# Patient Record
Sex: Female | Born: 2000 | Hispanic: Yes | Marital: Married | Smoking: Former smoker
Health system: Southern US, Community
[De-identification: ages and names within clinical notes are randomized; demographics above are authoritative.]

## PROBLEM LIST (undated history)

## (undated) ENCOUNTER — Inpatient Hospital Stay (HOSPITAL_COMMUNITY): Payer: Self-pay

## (undated) DIAGNOSIS — Z789 Other specified health status: Secondary | ICD-10-CM

## (undated) HISTORY — PX: WISDOM TOOTH EXTRACTION: SHX21

## (undated) HISTORY — PX: NO PAST SURGERIES: SHX2092

---
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
## 2018-01-22 ENCOUNTER — Telehealth (HOSPITAL_COMMUNITY): Payer: Self-pay | Admitting: *Deleted

## 2018-01-23 ENCOUNTER — Encounter (HOSPITAL_COMMUNITY): Payer: Self-pay | Admitting: *Deleted

## 2018-01-23 ENCOUNTER — Other Ambulatory Visit: Payer: Self-pay | Admitting: Advanced Practice Midwife

## 2018-01-23 NOTE — Telephone Encounter (Signed)
[phone] interpreter number  Preadmission screen

## 2018-01-26 ENCOUNTER — Inpatient Hospital Stay (HOSPITAL_COMMUNITY)
Admission: AD | Admit: 2018-01-26 | Discharge: 2018-01-26 | Disposition: A | Discharge status: Home / Self Care | Payer: Medicaid Other | Source: Ambulatory Visit | Attending: Obstetrics and Gynecology | Admitting: Obstetrics and Gynecology

## 2018-01-26 ENCOUNTER — Other Ambulatory Visit: Payer: Self-pay

## 2018-01-26 ENCOUNTER — Encounter (HOSPITAL_COMMUNITY): Payer: Self-pay | Admitting: *Deleted

## 2018-01-26 DIAGNOSIS — Z87891 Personal history of nicotine dependence: Secondary | ICD-10-CM | POA: Diagnosis not present

## 2018-01-26 DIAGNOSIS — Z3400 Encounter for supervision of normal first pregnancy, unspecified trimester: Secondary | ICD-10-CM

## 2018-01-26 DIAGNOSIS — O09893 Supervision of other high risk pregnancies, third trimester: Secondary | ICD-10-CM

## 2018-01-26 DIAGNOSIS — O98811 Other maternal infectious and parasitic diseases complicating pregnancy, first trimester: Secondary | ICD-10-CM

## 2018-01-26 DIAGNOSIS — O471 False labor at or after 37 completed weeks of gestation: Secondary | ICD-10-CM | POA: Diagnosis not present

## 2018-01-26 DIAGNOSIS — Z3A40 40 weeks gestation of pregnancy: Secondary | ICD-10-CM | POA: Insufficient documentation

## 2018-01-26 DIAGNOSIS — A749 Chlamydial infection, unspecified: Secondary | ICD-10-CM

## 2018-01-26 DIAGNOSIS — Z283 Underimmunization status: Secondary | ICD-10-CM

## 2018-01-26 DIAGNOSIS — Z3403 Encounter for supervision of normal first pregnancy, third trimester: Secondary | ICD-10-CM

## 2018-01-26 DIAGNOSIS — Z3493 Encounter for supervision of normal pregnancy, unspecified, third trimester: Secondary | ICD-10-CM

## 2018-01-26 DIAGNOSIS — Z79899 Other long term (current) drug therapy: Secondary | ICD-10-CM | POA: Insufficient documentation

## 2018-01-26 LAB — WET PREP, GENITAL
CLUE CELLS WET PREP: NONE SEEN
Sperm: NONE SEEN
TRICH WET PREP: NONE SEEN
YEAST WET PREP: NONE SEEN

## 2018-01-26 LAB — POCT FERN TEST: POCT Fern Test: NEGATIVE

## 2018-01-26 LAB — AMNISURE RUPTURE OF MEMBRANE (ROM) NOT AT ARMC: AMNISURE: NEGATIVE

## 2018-01-26 MED ORDER — IBUPROFEN 800 MG PO TABS: 800.0000 mg | ORAL_TABLET | Freq: Once | ORAL | Status: DC

## 2018-01-26 NOTE — Discharge Instructions (Signed)
Vaginal Delivery Vaginal delivery means that you will give birth by pushing your baby out of your birth canal (vagina). A team of health care providers will help you before, during, and after vaginal delivery. Birth experiences are unique for every woman and every pregnancy, and birth experiences vary depending on where you choose to give birth. What should I do to prepare for my baby's birth? Before your baby is born, it is important to talk with your health care provider about:  Your labor and delivery preferences. These may include: ? Medicines that you may be given. ? How you will manage your pain. This might include non-medical pain relief techniques or injectable pain relief such as epidural analgesia. ? How you and your baby will be monitored during labor and delivery. ? Who may be in the labor and delivery room with you. ? Your feelings about surgical delivery of your baby (cesarean delivery, or C-section) if this becomes necessary. ? Your feelings about receiving donated blood through an IV tube (blood transfusion) if this becomes necessary.  Whether you are able: ? To take pictures or videos of the birth. ? To eat during labor and delivery. ? To move around, walk, or change positions during labor and delivery.  What to expect after your baby is born, such as: ? Whether delayed umbilical cord clamping and cutting is offered. ? Who will care for your baby right after birth. ? Medicines or tests that may be recommended for your baby. ? Whether breastfeeding is supported in your hospital or birth center. ? How long you will be in the hospital or birth center.  How any medical conditions you have may affect your baby or your labor and delivery experience.  To prepare for your baby's birth, you should also:  Attend all of your health care visits before delivery (prenatal visits) as recommended by your health care provider. This is important.  Prepare your home for your baby's  arrival. Make sure that you have: ? Diapers. ? Baby clothing. ? Feeding equipment. ? Safe sleeping arrangements for you and your baby.  Install a car seat in your vehicle. Have your car seat checked by a certified car seat installer to make sure that it is installed safely.  Think about who will help you with your new baby at home for at least the first several weeks after delivery.  What can I expect when I arrive at the birth center or hospital? Once you are in labor and have been admitted into the hospital or birth center, your health care provider may:  Review your pregnancy history and any concerns you have.  Insert an IV tube into one of your veins. This is used to give you fluids and medicines.  Check your blood pressure, pulse, temperature, and heart rate (vital signs).  Check whether your bag of water (amniotic sac) has broken (ruptured).  Talk with you about your birth plan and discuss pain control options.  Monitoring Your health care provider may monitor your contractions (uterine monitoring) and your baby's heart rate (fetal monitoring). You may need to be monitored:  Often, but not continuously (intermittently).  All the time or for long periods at a time (continuously). Continuous monitoring may be needed if: ? You are taking certain medicines, such as medicine to relieve pain or make your contractions stronger. ? You have pregnancy or labor complications.  Monitoring may be done by:  Placing a special stethoscope or a handheld monitoring device on your abdomen to   check your baby's heartbeat, and feeling your abdomen for contractions. This method of monitoring does not continuously record your baby's heartbeat or your contractions.  Placing monitors on your abdomen (external monitors) to record your baby's heartbeat and the frequency and length of contractions. You may not have to wear external monitors all the time.  Placing monitors inside of your uterus  (internal monitors) to record your baby's heartbeat and the frequency, length, and strength of your contractions. ? Your health care provider may use internal monitors if he or she needs more information about the strength of your contractions or your baby's heart rate. ? Internal monitors are put in place by passing a thin, flexible wire through your vagina and into your uterus. Depending on the type of monitor, it may remain in your uterus or on your baby's head until birth. ? Your health care provider will discuss the benefits and risks of internal monitoring with you and will ask for your permission before inserting the monitors.  Telemetry. This is a type of continuous monitoring that can be done with external or internal monitors. Instead of having to stay in bed, you are able to move around during telemetry. Ask your health care provider if telemetry is an option for you.  Physical exam Your health care provider may perform a physical exam. This may include:  Checking whether your baby is positioned: ? With the head toward your vagina (head-down). This is most common. ? With the head toward the top of your uterus (head-up or breech). If your baby is in a breech position, your health care provider may try to turn your baby to a head-down position so you can deliver vaginally. If it does not seem that your baby can be born vaginally, your provider may recommend surgery to deliver your baby. In rare cases, you may be able to deliver vaginally if your baby is head-up (breech delivery). ? Lying sideways (transverse). Babies that are lying sideways cannot be delivered vaginally.  Checking your cervix to determine: ? Whether it is thinning out (effacing). ? Whether it is opening up (dilating). ? How low your baby has moved into your birth canal.  What are the three stages of labor and delivery?  Normal labor and delivery is divided into the following three stages: Stage 1  Stage 1 is the  longest stage of labor, and it can last for hours or days. Stage 1 includes: ? Early labor. This is when contractions may be irregular, or regular and mild. Generally, early labor contractions are more than 10 minutes apart. ? Active labor. This is when contractions get longer, more regular, more frequent, and more intense. ? The transition phase. This is when contractions happen very close together, are very intense, and may last longer than during any other part of labor.  Contractions generally feel mild, infrequent, and irregular at first. They get stronger, more frequent (about every 2-3 minutes), and more regular as you progress from early labor through active labor and transition.  Many women progress through stage 1 naturally, but you may need help to continue making progress. If this happens, your health care provider may talk with you about: ? Rupturing your amniotic sac if it has not ruptured yet. ? Giving you medicine to help make your contractions stronger and more frequent.  Stage 1 ends when your cervix is completely dilated to 4 inches (10 cm) and completely effaced. This happens at the end of the transition phase. Stage 2  Once   your cervix is completely effaced and dilated to 4 inches (10 cm), you may start to feel an urge to push. It is common for the body to naturally take a rest before feeling the urge to push, especially if you received an epidural or certain other pain medicines. This rest period may last for up to 1-2 hours, depending on your unique labor experience.  During stage 2, contractions are generally less painful, because pushing helps relieve contraction pain. Instead of contraction pain, you may feel stretching and burning pain, especially when the widest part of your baby's head passes through the vaginal opening (crowning).  Your health care provider will closely monitor your pushing progress and your baby's progress through the vagina during stage 2.  Your  health care provider may massage the area of skin between your vaginal opening and anus (perineum) or apply warm compresses to your perineum. This helps it stretch as the baby's head starts to crown, which can help prevent perineal tearing. ? In some cases, an incision may be made in your perineum (episiotomy) to allow the baby to pass through the vaginal opening. An episiotomy helps to make the opening of the vagina larger to allow more room for the baby to fit through.  It is very important to breathe and focus so your health care provider can control the delivery of your baby's head. Your health care provider may have you decrease the intensity of your pushing, to help prevent perineal tearing.  After delivery of your baby's head, the shoulders and the rest of the body generally deliver very quickly and without difficulty.  Once your baby is delivered, the umbilical cord may be cut right away, or this may be delayed for 1-2 minutes, depending on your baby's health. This may vary among health care providers, hospitals, and birth centers.  If you and your baby are healthy enough, your baby may be placed on your chest or abdomen to help maintain the baby's temperature and to help you bond with each other. Some mothers and babies start breastfeeding at this time. Your health care team will dry your baby and help keep your baby warm during this time.  Your baby may need immediate care if he or she: ? Showed signs of distress during labor. ? Has a medical condition. ? Was born too early (prematurely). ? Had a bowel movement before birth (meconium). ? Shows signs of difficulty transitioning from being inside the uterus to being outside of the uterus. If you are planning to breastfeed, your health care team will help you begin a feeding. Stage 3  The third stage of labor starts immediately after the birth of your baby and ends after you deliver the placenta. The placenta is an organ that develops  during pregnancy to provide oxygen and nutrients to your baby in the womb.  Delivering the placenta may require some pushing, and you may have mild contractions. Breastfeeding can stimulate contractions to help you deliver the placenta.  After the placenta is delivered, your uterus should tighten (contract) and become firm. This helps to stop bleeding in your uterus. To help your uterus contract and to control bleeding, your health care provider may: ? Give you medicine by injection, through an IV tube, by mouth, or through your rectum (rectally). ? Massage your abdomen or perform a vaginal exam to remove any blood clots that are left in your uterus. ? Empty your bladder by placing a thin, flexible tube (catheter) into your bladder. ? Encourage   you to breastfeed your baby. After labor is over, you and your baby will be monitored closely to ensure that you are both healthy until you are ready to go home. Your health care team will teach you how to care for yourself and your baby. This information is not intended to replace advice given to you by your health care provider. Make sure you discuss any questions you have with your health care provider. Document Released: 04/12/2008 Document Revised: 01/22/2016 Document Reviewed: 07/19/2015 Elsevier Interactive Patient Education  2018 [employer]  

## 2018-01-26 NOTE — MAU Note (Signed)
Since around 1900, has been leaking fluid- clear watery.  Has changed underwear twice.  Some mucous.  Having some contractions, not regular, no bleeding. Was 3 cm when last checked.

## 2018-01-26 NOTE — MAU Provider Note (Signed)
History     CSN: [id number]  Arrival date and time: 01/26/18 1144   None     Chief Complaint  Patient presents with  . Contractions  . Rupture of Membranes   HPI Cheryl Maynard is a G1P0 at 5684w4d who presents to MAU for rule out rupture. Patient reports noticing an increase in vaginal discharge two days ago and has continued to leak since that time. Denies vaginal bleeding, decreased fetal movement, fever, falls, or recent illness. Endorses mild abdominal contractions. "Crampy" 4/10, bilateral low abdomen, not radiating, no aggravating or alleviating factors.    OB History    Gravida  1   Para      Term      Preterm      AB      Living        SAB      TAB      Ectopic      Multiple      Live Births              Past Medical History:  Diagnosis Date  . Medical history non-contributory     Past Surgical History:  Procedure Laterality Date  . NO PAST SURGERIES      Family History  Problem Relation Age of Onset  . Anemia Mother   . ADD / ADHD Neg Hx   . Alcohol abuse Neg Hx   . Anxiety disorder Neg Hx   . Asthma Neg Hx   . Arthritis Neg Hx   . Birth defects Neg Hx   . Cancer Neg Hx   . COPD Neg Hx   . Depression Neg Hx   . Diabetes Neg Hx   . Drug abuse Neg Hx   . Hearing loss Neg Hx   . Early death Neg Hx   . Heart disease Neg Hx   . Hyperlipidemia Neg Hx   . Hypertension Neg Hx   . Intellectual disability Neg Hx   . Kidney disease Neg Hx   . Learning disabilities Neg Hx   . Miscarriages / Stillbirths Neg Hx   . Obesity Neg Hx   . Stroke Neg Hx   . Vision loss Neg Hx   . Varicose Veins Neg Hx     Social History   Tobacco Use  . Smoking status: Former Smoker    Types: Cigarettes  . Smokeless tobacco: Never Used  . Tobacco comment: quit with +preg test  Substance Use Topics  . Alcohol use: No  . Drug use: No    Allergies: No Known Allergies  Medications Prior to Admission  Medication Sig Dispense Refill Last Dose   . meclizine (ANTIVERT) 12.5 MG tablet Take 1 tablet (12.5 mg total) by mouth 3 (three) times daily as needed for dizziness. (Patient not taking: Reported on 07/20/2017) 30 tablet 0 Not Taking  . Prenatal Vit-Fe Fumarate-FA (PRENATAL VITAMIN PO) Take by mouth.   Taking    Review of Systems  Gastrointestinal: Positive for abdominal pain.       Irregular mild contractions  Genitourinary: Positive for vaginal discharge. Negative for vaginal bleeding and vaginal pain.  All other systems reviewed and are negative.  Physical Exam   Blood pressure 122/77, pulse 83, temperature 97.9 F (36.6 C), temperature source Oral, resp. rate 16, weight 147 lb 12 oz (67 kg), last menstrual period 04/17/2017, SpO2 99 %.  Physical Exam  Nursing note and vitals reviewed. Constitutional: She is oriented to person, place, and time. She  appears well-developed and well-nourished.  HENT:  Head: Normocephalic.  Cardiovascular: Normal rate, regular rhythm, normal heart sounds and intact distal pulses.  Respiratory: Effort normal and breath sounds normal.  GI:  Gravid  Genitourinary: Uterus normal. Vaginal discharge found.  Genitourinary Comments: Thin white vaginal discharge visible on speculum exam Positive pooling SVE 3/50/-3  Neurological: She is alert and oriented to person, place, and time. She has normal reflexes.  Skin: Skin is warm and dry.    MAU Course  Procedures  MDM --Positive pooling --Negative Fern --Amnisure Negative --Reactive NST: baseline 130, moderate variability, positive accelerations, no decelerations --Toco: irregular mild contractions q  --3/50/-3 by my exam  Patient Vitals for the past 24 hrs:  BP Temp Temp src Pulse Resp SpO2 Weight  01/26/18 1423 123/85 - - 74 - - -  01/26/18 1203 122/77 97.9 F (36.6 C) Oral 83 16 99 % 147 lb 12 oz (67 kg)    Results for orders placed or performed during the hospital encounter of 01/26/18 (from the past 24 hour(s))  Fern Test      Status: None   Collection Time: 01/26/18 12:42 PM  Result Value Ref Range   POCT Fern Test Negative = intact amniotic membranes   Wet prep, genital     Status: Abnormal   Collection Time: 01/26/18  1:19 PM  Result Value Ref Range   Yeast Wet Prep HPF POC NONE SEEN NONE SEEN   Trich, Wet Prep NONE SEEN NONE SEEN   Clue Cells Wet Prep HPF POC NONE SEEN NONE SEEN   WBC, Wet Prep HPF POC FEW (A) NONE SEEN   Sperm NONE SEEN   Amnisure rupture of membrane (rom)not at [facility]     Status: None   Collection Time: 01/26/18  1:19 PM  Result Value Ref Range   Amnisure ROM NEGATIVE    Assessment and Plan  --17 y.o. G1P0 at [redacted]w[redacted]d  --Reactive NST --Intact membranes --Not laboring --IOL scheduled for Monday 01/29/18 --Reviewed general obstetric precautions including but not limited to falls, fever, vaginal bleeding, leaking of fluid,    decreased fetal movement, headache not relieved by Tylenol, rest and PO hydration.  --Discharge home in stable condition  Calvert Cantor, [place] 01/26/2018, 2:06 PM

## 2018-01-28 ENCOUNTER — Encounter (HOSPITAL_COMMUNITY): Payer: Self-pay | Admitting: *Deleted

## 2018-01-28 ENCOUNTER — Inpatient Hospital Stay (HOSPITAL_COMMUNITY): Payer: Medicaid Other | Admitting: Anesthesiology

## 2018-01-28 ENCOUNTER — Inpatient Hospital Stay (HOSPITAL_COMMUNITY)
Admission: AD | Admit: 2018-01-28 | Discharge: 2018-01-30 | DRG: 806 | Disposition: A | Discharge status: Home / Self Care | Payer: Medicaid Other | Source: Ambulatory Visit | Attending: Obstetrics & Gynecology | Admitting: Obstetrics & Gynecology

## 2018-01-28 ENCOUNTER — Other Ambulatory Visit: Payer: Self-pay

## 2018-01-28 DIAGNOSIS — O480 Post-term pregnancy: Secondary | ICD-10-CM | POA: Diagnosis present

## 2018-01-28 DIAGNOSIS — Z3A40 40 weeks gestation of pregnancy: Secondary | ICD-10-CM

## 2018-01-28 DIAGNOSIS — Z3403 Encounter for supervision of normal first pregnancy, third trimester: Secondary | ICD-10-CM

## 2018-01-28 LAB — CBC
HEMATOCRIT: 38.2 % (ref 36.0–46.0)
Hemoglobin: 13.2 g/dL (ref 12.0–15.0)
MCH: 30.5 pg (ref 26.0–34.0)
MCHC: 34.6 g/dL (ref 30.0–36.0)
MCV: 88.2 fL (ref 78.0–100.0)
PLATELETS: 327 10*3/uL (ref 150–400)
RBC: 4.33 MIL/uL (ref 3.87–5.11)
RDW: 13.8 % (ref 11.5–15.5)
WBC: 7.9 10*3/uL (ref 4.0–10.5)

## 2018-01-28 LAB — POCT FERN TEST: POCT Fern Test: POSITIVE

## 2018-01-28 LAB — TYPE AND SCREEN
ABO/RH(D): O POS
Antibody Screen: NEGATIVE

## 2018-01-28 LAB — ABO/RH: ABO/RH(D): O POS

## 2018-01-28 LAB — RPR: RPR Ser Ql: NONREACTIVE

## 2018-01-28 MED ORDER — COCONUT OIL OIL: 1.0000 "application " | TOPICAL_OIL | Status: DC | PRN

## 2018-01-28 MED ORDER — ONDANSETRON HCL 4 MG/2ML IJ SOLN
4.0000 mg | Freq: Four times a day (QID) | INTRAMUSCULAR | Status: DC | PRN
  Administered 2018-01-28: 4 mg via INTRAVENOUS
  Filled 2018-01-28: qty 2

## 2018-01-28 MED ORDER — LIDOCAINE HCL (PF) 1 % IJ SOLN
INTRAMUSCULAR | Status: DC | PRN
  Administered 2018-01-28: 5 mL via EPIDURAL

## 2018-01-28 MED ORDER — ACETAMINOPHEN 325 MG PO TABS: 650.0000 mg | ORAL_TABLET | ORAL | Status: DC | PRN

## 2018-01-28 MED ORDER — LACTATED RINGERS IV SOLN
500.0000 mL | INTRAVENOUS | Status: DC | PRN
  Administered 2018-01-28: 1000 mL via INTRAVENOUS

## 2018-01-28 MED ORDER — SOD CITRATE-CITRIC ACID 500-334 MG/5ML PO SOLN: 30.0000 mL | ORAL | Status: DC | PRN

## 2018-01-28 MED ORDER — LACTATED RINGERS IV SOLN
INTRAVENOUS | Status: DC
  Administered 2018-01-28 (×2): via INTRAVENOUS

## 2018-01-28 MED ORDER — DIBUCAINE 1 % RE OINT: 1.0000 "application " | TOPICAL_OINTMENT | RECTAL | Status: DC | PRN

## 2018-01-28 MED ORDER — IBUPROFEN 600 MG PO TABS
600.0000 mg | ORAL_TABLET | Freq: Four times a day (QID) | ORAL | Status: DC
  Administered 2018-01-28 – 2018-01-30 (×8): 600 mg via ORAL
  Filled 2018-01-28 (×8): qty 1

## 2018-01-28 MED ORDER — TETANUS-DIPHTH-ACELL PERTUSSIS 5-2.5-18.5 LF-MCG/0.5 IM SUSP: 0.5000 mL | Freq: Once | INTRAMUSCULAR | Status: DC

## 2018-01-28 MED ORDER — OXYTOCIN BOLUS FROM INFUSION
500.0000 mL | Freq: Once | INTRAVENOUS | Status: AC
  Administered 2018-01-28: 500 mL via INTRAVENOUS

## 2018-01-28 MED ORDER — PHENYLEPHRINE 40 MCG/ML (10ML) SYRINGE FOR IV PUSH (FOR BLOOD PRESSURE SUPPORT)
80.0000 ug | PREFILLED_SYRINGE | INTRAVENOUS | Status: DC | PRN
  Filled 2018-01-28: qty 5

## 2018-01-28 MED ORDER — OXYTOCIN 40 UNITS IN LACTATED RINGERS INFUSION - SIMPLE MED: 2.5000 [IU]/h | INTRAVENOUS | Status: DC

## 2018-01-28 MED ORDER — LACTATED RINGERS IV SOLN
500.0000 mL | Freq: Once | INTRAVENOUS | Status: DC
Start: 2018-01-28 — End: 2018-01-28

## 2018-01-28 MED ORDER — EPHEDRINE 5 MG/ML INJ: 10.0000 mg | INTRAVENOUS | Status: DC | PRN

## 2018-01-28 MED ORDER — DIPHENHYDRAMINE HCL 25 MG PO CAPS: 25.0000 mg | ORAL_CAPSULE | Freq: Four times a day (QID) | ORAL | Status: DC | PRN

## 2018-01-28 MED ORDER — PRENATAL MULTIVITAMIN CH
1.0000 | ORAL_TABLET | Freq: Every day | ORAL | Status: DC
  Administered 2018-01-29 – 2018-01-30 (×2): 1 via ORAL
  Filled 2018-01-28 (×2): qty 1

## 2018-01-28 MED ORDER — ZOLPIDEM TARTRATE 5 MG PO TABS: 5.0000 mg | ORAL_TABLET | Freq: Every evening | ORAL | Status: DC | PRN

## 2018-01-28 MED ORDER — DIPHENHYDRAMINE HCL 50 MG/ML IJ SOLN: 12.5000 mg | INTRAMUSCULAR | Status: DC | PRN

## 2018-01-28 MED ORDER — BENZOCAINE-MENTHOL 20-0.5 % EX AERO
1.0000 "application " | INHALATION_SPRAY | CUTANEOUS | Status: DC | PRN
  Administered 2018-01-28: 1 via TOPICAL
  Filled 2018-01-28: qty 56

## 2018-01-28 MED ORDER — OXYTOCIN 40 UNITS IN LACTATED RINGERS INFUSION - SIMPLE MED
1.0000 m[IU]/min | INTRAVENOUS | Status: DC
  Administered 2018-01-28: 2 m[IU]/min via INTRAVENOUS
  Filled 2018-01-28: qty 1000

## 2018-01-28 MED ORDER — ONDANSETRON HCL 4 MG PO TABS: 4.0000 mg | ORAL_TABLET | ORAL | Status: DC | PRN

## 2018-01-28 MED ORDER — EPHEDRINE 5 MG/ML INJ
10.0000 mg | INTRAVENOUS | Status: DC | PRN
  Filled 2018-01-28: qty 2

## 2018-01-28 MED ORDER — SIMETHICONE 80 MG PO CHEW: 80.0000 mg | CHEWABLE_TABLET | ORAL | Status: DC | PRN

## 2018-01-28 MED ORDER — WITCH HAZEL-GLYCERIN EX PADS: 1.0000 "application " | MEDICATED_PAD | CUTANEOUS | Status: DC | PRN

## 2018-01-28 MED ORDER — FENTANYL 2.5 MCG/ML BUPIVACAINE 1/10 % EPIDURAL INFUSION (WH - ANES)
14.0000 mL/h | INTRAMUSCULAR | Status: DC | PRN
  Administered 2018-01-28: 14 mL/h via EPIDURAL
  Filled 2018-01-28: qty 100

## 2018-01-28 MED ORDER — OXYCODONE-ACETAMINOPHEN 5-325 MG PO TABS
1.0000 | ORAL_TABLET | ORAL | Status: DC | PRN
Start: 2018-01-28 — End: 2018-01-28

## 2018-01-28 MED ORDER — PHENYLEPHRINE 40 MCG/ML (10ML) SYRINGE FOR IV PUSH (FOR BLOOD PRESSURE SUPPORT)
80.0000 ug | PREFILLED_SYRINGE | INTRAVENOUS | Status: DC | PRN
  Filled 2018-01-28: qty 5
  Filled 2018-01-28: qty 10

## 2018-01-28 MED ORDER — LACTATED RINGERS IV SOLN: 500.0000 mL | Freq: Once | INTRAVENOUS | Status: DC

## 2018-01-28 MED ORDER — METHYLERGONOVINE MALEATE 0.2 MG PO TABS
0.2000 mg | ORAL_TABLET | Freq: Four times a day (QID) | ORAL | Status: AC
  Administered 2018-01-28 – 2018-01-29 (×3): 0.2 mg via ORAL
  Filled 2018-01-28 (×3): qty 1

## 2018-01-28 MED ORDER — SENNOSIDES-DOCUSATE SODIUM 8.6-50 MG PO TABS
2.0000 | ORAL_TABLET | ORAL | Status: DC
  Administered 2018-01-28 – 2018-01-30 (×2): 2 via ORAL
  Filled 2018-01-28 (×2): qty 2

## 2018-01-28 MED ORDER — TERBUTALINE SULFATE 1 MG/ML IJ SOLN
0.2500 mg | Freq: Once | INTRAMUSCULAR | Status: DC | PRN
  Filled 2018-01-28: qty 1

## 2018-01-28 MED ORDER — ONDANSETRON HCL 4 MG/2ML IJ SOLN: 4.0000 mg | INTRAMUSCULAR | Status: DC | PRN

## 2018-01-28 MED ORDER — MEDROXYPROGESTERONE ACETATE 150 MG/ML IM SUSP
150.0000 mg | Freq: Once | INTRAMUSCULAR | Status: AC
  Administered 2018-01-30: 150 mg via INTRAMUSCULAR
  Filled 2018-01-28: qty 1

## 2018-01-28 MED ORDER — OXYCODONE-ACETAMINOPHEN 5-325 MG PO TABS: 2.0000 | ORAL_TABLET | ORAL | Status: DC | PRN

## 2018-01-28 MED ORDER — LIDOCAINE HCL (PF) 1 % IJ SOLN
30.0000 mL | INTRAMUSCULAR | Status: DC | PRN
  Filled 2018-01-28: qty 30

## 2018-01-28 MED ORDER — FENTANYL CITRATE (PF) 100 MCG/2ML IJ SOLN: 100.0000 ug | INTRAMUSCULAR | Status: DC | PRN

## 2018-01-28 MED ORDER — PHENYLEPHRINE 40 MCG/ML (10ML) SYRINGE FOR IV PUSH (FOR BLOOD PRESSURE SUPPORT): 80.0000 ug | PREFILLED_SYRINGE | INTRAVENOUS | Status: DC | PRN

## 2018-01-28 NOTE — Anesthesia Preprocedure Evaluation (Signed)
Anesthesia Evaluation  Patient identified by MRN, date of birth, ID band Patient awake    Reviewed: Allergy & Precautions, NPO status , Patient's Chart, lab work & pertinent test results  Airway Mallampati: II  TM Distance: >3 FB Neck ROM: Full    Dental no notable dental hx. (+) Teeth Intact   Pulmonary neg pulmonary ROS, former smoker,    Pulmonary exam normal breath sounds clear to auscultation       Cardiovascular negative cardio ROS Normal cardiovascular exam Rhythm:Regular Rate:Normal     Neuro/Psych negative neurological ROS  negative psych ROS   GI/Hepatic   Endo/Other    Renal/GU      Musculoskeletal   Abdominal   Peds  Hematology   Anesthesia Other Findings   Reproductive/Obstetrics (+) Pregnancy                             Lab Results  Component Value Date   WBC 7.9 01/28/2018   HGB 13.2 01/28/2018   HCT 38.2 01/28/2018   MCV 88.2 01/28/2018   PLT 327 01/28/2018    Anesthesia Physical Anesthesia Plan  ASA: II  Anesthesia Plan: Epidural   Post-op Pain Management:    Induction:   PONV Risk Score and Plan:   Airway Management Planned:   Additional Equipment:   Intra-op Plan:   Post-operative Plan:   Informed Consent: I have reviewed the patients History and Physical, chart, labs and discussed the procedure including the risks, benefits and alternatives for the proposed anesthesia with the patient or authorized representative who has indicated his/her understanding and acceptance.     Plan Discussed with:   Anesthesia Plan Comments:         Anesthesia Quick Evaluation

## 2018-01-28 NOTE — Progress Notes (Signed)
Patient ID: [name], female   DOB: 2001/01/28, 17 y.o.   MRN: [id number]  Labor Progress Note [name] is a 17 y.o. G1P0 at 4317w6d presented for SOL.  S: introduced self to patient. Patient said she was ok and didn't need anything at the moment.   O:  BP 122/75   Pulse 81   Temp 98.1 F (36.7 C) (Oral)   Resp 18   Ht 5' (1.524 m)   Wt 67.1 kg (148 lb)   LMP 04/17/2017   SpO2 99%   BMI 28.90 kg/m  EFM: 135/moderate variablity/present accels.   CVE: Dilation: 6 Effacement (%): 100 Station: -1 Presentation: Vertex Exam by:: Valentina Lucks. Woods, RN   A&P: 17 y.o. G1P0 6017w6d for SOL.  #Labor: Progressing well. Patient currently on pitocin. SROM #Pain: epidural #FWB: cat I #GBS negative   Sandre Kittyaniel K Terasa Orsini, MD 11:40 AM

## 2018-01-28 NOTE — Progress Notes (Signed)
Eliseo GumVioleta Gonzalez-islas is a 17 y.o. G1P0 at 3937w6d by ultrasound admitted for active labor  Subjective:   Objective: BP 122/78   Pulse 76   Temp 98 F (36.7 C) (Oral)   Resp 18   Ht 5' (1.524 m)   Wt 148 lb (67.1 kg)   LMP 04/17/2017   SpO2 99%   BMI 28.90 kg/m  No intake/output data recorded. Total I/O In: 1134.6 [I.V.:1134.6] Out: 300 [Urine:300]  FHT:  FHR: 120's bpm, variability: moderate,  accelerations:  Present,  decelerations:  Absent UC:   regular, every 2 minutes SVE:   Dilation: 6 Effacement (%): 100 Station: -1 Exam by:: Valentina Lucks. Woods, RN  Labs: Lab Results  Component Value Date   WBC 7.9 01/28/2018   HGB 13.2 01/28/2018   HCT 38.2 01/28/2018   MCV 88.2 01/28/2018   PLT 327 01/28/2018    Assessment / Plan: Spontaneous labor, progressing normally  Labor: Progressing normally Preeclampsia:  no signs or symptoms of toxicity Fetal Wellbeing:  Category I Pain Control:  Epidural I/D:  n/a Anticipated MOD:  NSVD  Wyvonnia DuskyMarie Haislee Corso 01/28/2018, 9:27 AM

## 2018-01-28 NOTE — Anesthesia Postprocedure Evaluation (Signed)
Anesthesia Post Note  Patient: Cheryl Maynard  Procedure(s) Performed: AN AD HOC LABOR EPIDURAL     Patient location during evaluation: Mother Baby Anesthesia Type: Epidural Level of consciousness: awake and alert and oriented Pain management: satisfactory to patient Vital Signs Assessment: post-procedure vital signs reviewed and stable Respiratory status: respiratory function stable Cardiovascular status: stable Postop Assessment: no headache, no backache, epidural receding, patient able to bend at knees, no signs of nausea or vomiting and adequate PO intake Anesthetic complications: no    Last Vitals:  Vitals:   01/28/18 1650 01/28/18 1744  BP: 123/79 133/88  Pulse: 82 78  Resp: 18 18  Temp: 36.9 C 36.8 C  SpO2:      Last Pain:  Vitals:   01/28/18 1744  TempSrc: Oral  PainSc:    Pain Goal: Patients Stated Pain Goal: 8 (01/28/18 0344)               Karleen DolphinFUSSELL,Cheryl Maynard

## 2018-01-28 NOTE — Anesthesia Procedure Notes (Signed)
Epidural Patient location during procedure: OB Start time: 01/28/2018 7:52 AM End time: 01/28/2018 8:07 AM  Staffing Anesthesiologist: Trevor IhaHouser, Stephen A, MD Performed: anesthesiologist   Preanesthetic Checklist Completed: patient identified, site marked, surgical consent, pre-op evaluation, timeout performed, IV checked, risks and benefits discussed and monitors and equipment checked  Epidural Patient position: sitting Prep: site prepped and draped and DuraPrep Patient monitoring: continuous pulse ox and blood pressure Approach: midline Location: L3-L4 Injection technique: LOR air  Needle:  Needle type: Tuohy  Needle gauge: 17 G Needle length: 9 cm and 9 Needle insertion depth: 5 cm cm Catheter type: closed end flexible Catheter size: 19 Gauge Catheter at skin depth: 10 cm Test dose: negative  Assessment Events: blood not aspirated, injection not painful, no injection resistance, negative IV test and no paresthesia

## 2018-01-28 NOTE — MAU Note (Signed)
Pt presents to MAU c/o possible SROM pt states around 1500 01/27/18 she noticed some clear discharge that over the hours began to trickle down her leg. Pt states she is ctx every 5min. +FM.

## 2018-01-28 NOTE — H&P (Addendum)
OBSTETRIC ADMISSION HISTORY AND PHYSICAL  Cheryl Maynard is a 17 y.o. female G1P0 with IUP at 4076w6d by first trimester U/S presenting for active labor s/p SROM at 1500. Hx of treatement for positive chlamydia on 07/06/17; TOC negative x2. She reports +FMs, LOF today at 1500 , no VB, no blurry vision, headaches or peripheral edema, and RUQ pain.  She plans on breast feeding. She request Depo for birth control. [facility]   Dating: By First trimester U/S --->  Estimated Date of Delivery: 01/22/18  Sono:   -SIUP at 19+2 weeks,  No gross abnormalities identified, AFI normal, cervical length 3.9cm, cephalic, placenta posterior, EFW 286.39g  Prenatal History/Complications: -Hx of positive chlamydia on 07/06/17---received azithromycin -varicella non-immune  Past Medical History: Past Medical History:  Diagnosis Date  . Medical history non-contributory     Past Surgical History: Past Surgical History:  Procedure Laterality Date  . NO PAST SURGERIES      Obstetrical History: OB History    Gravida  1   Para      Term      Preterm      AB      Living        SAB      TAB      Ectopic      Multiple      Live Births              Social History: Social History   Socioeconomic History  . Marital status: Single    Spouse name: Not on file  . Number of children: Not on file  . Years of education: Not on file  . Highest education level: Not on file  Occupational History  . Not on file  Social Needs  . Financial resource strain: Not on file  . Food insecurity:    Worry: Not on file    Inability: Not on file  . Transportation needs:    Medical: Not on file    Non-medical: Not on file  Tobacco Use  . Smoking status: Former Smoker    Types: Cigarettes  . Smokeless tobacco: Never Used  . Tobacco comment: quit with +preg test  Substance and Sexual Activity  . Alcohol use: No  . Drug use: No  . Sexual activity: Yes    Birth  control/protection: None  Lifestyle  . Physical activity:    Days per week: Not on file    Minutes per session: Not on file  . Stress: Not on file  Relationships  . Social connections:    Talks on phone: Not on file    Gets together: Not on file    Attends religious service: Not on file    Active member of club or organization: Not on file    Attends meetings of clubs or organizations: Not on file    Relationship status: Not on file  Other Topics Concern  . Not on file  Social History Narrative  . Not on file    Family History: Family History  Problem Relation Age of Onset  . Anemia Mother   . ADD / ADHD Neg Hx   . Alcohol abuse Neg Hx   . Anxiety disorder Neg Hx   . Asthma Neg Hx   . Arthritis Neg Hx   . Birth defects Neg Hx   . Cancer Neg Hx   . COPD Neg Hx   . Depression Neg Hx   . Diabetes Neg Hx   .  Drug abuse Neg Hx   . Hearing loss Neg Hx   . Early death Neg Hx   . Heart disease Neg Hx   . Hyperlipidemia Neg Hx   . Hypertension Neg Hx   . Intellectual disability Neg Hx   . Kidney disease Neg Hx   . Learning disabilities Neg Hx   . Miscarriages / Stillbirths Neg Hx   . Obesity Neg Hx   . Stroke Neg Hx   . Vision loss Neg Hx   . Varicose Veins Neg Hx     Allergies: No Known Allergies  Medications Prior to Admission  Medication Sig Dispense Refill Last Dose  . Prenatal Vit-Fe Fumarate-FA (PRENATAL VITAMIN PO) Take by mouth.   01/27/2018 at Unknown time     Review of Systems   All systems reviewed and negative except as stated in HPI  Blood pressure 136/87, pulse 77, temperature 98.2 F (36.8 C), temperature source Oral, height 5' (1.524 m), last menstrual period 04/17/2017. General appearance: alert, cooperative and no distress Lungs: no respiratory distress Heart: regular rate and rhythm Abdomen: soft, non-tender Pelvic: adequate Extremities: Homans sign is negative, no sign of DVT Presentation: Vertex Fetal monitoringBaseline: 130 bpm,  Variability: Good {> 6 bpm), Accelerations: Present and Decelerations: Absent Uterine activity: Difficult to monitor on screen but pt states getting stronger and closer together Dilation: 5 Effacement (%): 90 Station: -1 Exam by:: Ralene Muskrat RN    Prenatal labs: ABO, Rh: O/Positive/-- (12/20 0000) Antibody: Negative (12/20 0000) Rubella: Immune (12/20 0000) RPR: Nonreactive (12/20 0000)  HBsAg: Negative (12/20 0000)  HIV: Non-reactive (12/20 0000)  GBS: Negative (06/13 0000)  1 hr Glucola: normal  Genetic screening: normal Anatomy US: Normal female  Prenatal Transfer Tool  Maternal Diabetes: No Genetic Screening: Normal Maternal Ultrasounds/Referrals: Normal Fetal Ultrasounds or other Referrals:  None Maternal Substance Abuse:  No Significant Maternal Medications:  None Significant Maternal Lab Results: Lab values include: Group B Strep negative, Other: Positive Chlamydia screen 07/06/17, treated with azithromycin  Results for orders placed or performed during the hospital encounter of 01/28/18 (from the past 24 hour(s))  Fern Test   Collection Time: 01/28/18  3:01 AM  Result Value Ref Range   POCT Fern Test Positive = ruptured amniotic membanes   CBC   Collection Time: 01/28/18  3:15 AM  Result Value Ref Range   WBC 7.9 4.0 - 10.5 K/uL   RBC 4.33 3.87 - 5.11 MIL/uL   Hemoglobin 13.2 12.0 - 15.0 g/dL   HCT 56.3 87.5 - 64.3 %   MCV 88.2 78.0 - 100.0 fL   MCH 30.5 26.0 - 34.0 pg   MCHC 34.6 30.0 - 36.0 g/dL   RDW 32.9 51.8 - 84.1 %   Platelets 327 150 - 400 K/uL    Patient Active Problem List   Diagnosis Date Noted  . Normal labor 01/28/2018  . Susceptible to Varicella (non-immune), currently pregnant in third trimester 01/26/2018  . Encounter for supervision of normal first pregnancy in third trimester 01/26/2018  . Chlamydia infection affecting pregnancy in first trimester 01/26/2018  . Supervision of normal first teen pregnancy 01/26/2018    Assessment/Plan:   Cheryl Maynard is a 17 y.o. G1P0 at [redacted]w[redacted]d here for active labor s/p SOL.  #Labor: Expectant management with labor support #Pain: Epidural at patients request, IV pain medication first #FWB: Category 1 #ID: N/A #MOF: Breast #MOC:Depo  Rolland Bimler, Medical Student  01/28/2018, 3:58 AM  OB FELLOW HISTORY AND PHYSICAL ATTESTATION  I confirm that I have verified the information documented in the medical student's note and that I have also personally reperformed the physical exam and all medical decision making activities. I agree with above documentation and have made edits as needed.   Caryl Ada, DO OB Fellow 01/28/2018, 5:33 AM

## 2018-01-28 NOTE — Anesthesia Pain Management Evaluation Note (Signed)
  CRNA Pain Management Visit Note  Patient: Cheryl Maynard, 17 y.o., female  "Hello I am a member of the anesthesia team at [facility]. We have an anesthesia team available at all times to provide care throughout the hospital, including epidural management and anesthesia for C-section. I don't know your plan for the delivery whether it a natural birth, water birth, IV sedation, nitrous supplementation, doula or epidural, but we want to meet your pain goals."   1.Was your pain managed to your expectations on prior hospitalizations?   No prior hospitalizations  2.What is your expectation for pain management during this hospitalization?     Epidural  3.How can we help you reach that goal? Epidural being placed.  Record the patient's initial score and the patient's pain goal.   Pain: 8  Pain Goal: 8 [facility] wants you to be able to say your pain was always managed very well.  Briar Sword 01/28/2018

## 2018-01-28 NOTE — MAU Provider Note (Signed)
First Provider Initiated Contact with Patient 01/28/18 [phone]       S: Ms. Cheryl Maynard is a 17 y.o. G1P0 at 8222w6d  who presents to MAU today complaining of leaking of fluid since 3 pm on 01/27/18. She denies vaginal bleeding. She endorses contractions. She reports normal fetal movement.    O: LMP 04/17/2017  GENERAL: Well-developed, well-nourished female in no acute distress.  HEAD: Normocephalic, atraumatic.  CHEST: Normal effort of breathing, regular heart rate ABDOMEN: Soft, nontender, gravid PELVIC: Normal external female genitalia. Vagina is pink and rugated. Cervix with normal contour, no lesions. Normal discharge.  positive pooling.   Cervical exam:  Dilation: 5 Effacement (%): 90 Station: -1 Presentation: Vertex Exam by:: Cheryl Cox RN    Fetal Monitoring: Baseline:130 Variability: moderate Accelerations: present Decelerations: early decels only Contractions: Q 3-4 min  Results for orders placed or performed during the hospital encounter of 01/28/18 (from the past 24 hour(s))  Fern Test     Status: Abnormal   Collection Time: 01/28/18  3:01 AM  Result Value Ref Range   POCT Fern Test Positive = ruptured amniotic membanes      A: SIUP at 6922w6d  SROM  Active labor at term  P: RN to notify [facility] providers [facility]  Hurshel PartyLeftwich-Kirby, Rose Hegner A, CNM 01/28/2018 3:06 AM

## 2018-01-29 ENCOUNTER — Inpatient Hospital Stay (HOSPITAL_COMMUNITY): Admission: RE | Admit: 2018-01-29 | Payer: Medicaid Other | Source: Ambulatory Visit

## 2018-01-29 NOTE — Lactation Note (Addendum)
This note was copied from a baby's chart. Lactation Consultation Note Baby 14 hrs old. Mom stated she didn't need an interpreter. Mom has small short shaft compressible nipples. Hand expression w/colostrum noted.  Hand pump given to pre-pump prior to latching. Mom states baby isn't latching well. Asked mom to call for next latch for assistance. Newborn behavior, STS, I&O, cluster feeding, supply and demand. Mom encouraged to feed baby 8-12 times/24 hours and with feeding cues. Mom encouraged to waken baby for feeds if hasn't cued in 3 hrs. Call for assistance.  WH/LC brochure given w/resources, support groups and LC services.  Patient Name: Cheryl Maynard [id number]'s Date: 01/29/2018 Reason for consult: Initial assessment   Maternal Data Has patient been taught Hand Expression?: Yes Does the patient have breastfeeding experience prior to this delivery?: No  Feeding Feeding Type: Breast Fed Length of feed: 50 min  LATCH Score       Type of Nipple: Everted at rest and after stimulation(short shaft)  Comfort (Breast/Nipple): Soft / non-tender        Interventions Interventions: Breast feeding basics reviewed;Position options;Skin to skin;Breast massage;Hand express;Pre-pump if needed;Hand pump;Breast compression  Lactation Tools Discussed/Used Tools: Pump Breast pump type: Manual WIC Program: No Pump Review: Setup, frequency, and cleaning;Milk Storage Initiated by:: Peri JeffersonL. Shubham Thackston RN IBCLC Date initiated:: 01/29/18   Consult Status Consult Status: Follow-up Date: 01/29/18 Follow-up type: In-patient    Charyl DancerCARVER, Tobyn Osgood G 01/29/2018, 4:16 AM

## 2018-01-29 NOTE — Progress Notes (Signed)
POSTPARTUM PROGRESS NOTE  Post Partum Day 1  Subjective:  Cheryl Maynard is a 17 y.o. G1P1001 6941w6d s/p svd/sol augmented with pitocin.  No acute events overnight.  Pt denies problems with ambulating, voiding or po intake.  She denies nausea or vomiting.  Pain is well controlled. Lochia Moderate. Pt reports that it is not heavy and she is not concerned about the amount.   Objective: Blood pressure 110/71, pulse 71, temperature 98.3 F (36.8 C), temperature source Oral, resp. rate 18, height 5' (1.524 m), weight 67.1 kg (148 lb), last menstrual period 04/17/2017, SpO2 97 %, unknown if currently breastfeeding.  Physical Exam:  General: alert, cooperative and no distress Lochia:normal flow Pulm: no respiratory distress, ctab Heart:regular rate, no m/r/g auscultated Abdomen: soft, nontender,  Uterine Fundus: firm, appropriately tender DVT Evaluation: No calf swelling or tenderness Extremities: no edema  Laceration: R. Vaginal/Labial  Recent Labs    01/28/18 0315  HGB 13.2  HCT 38.2    Assessment/Plan:  ASSESSMENT: Cheryl Maynard a 17 y.o. G1P1001 3441w6d s/p  ppd1 svd, afebrile, w. vitals w/in normal limits, and an unremarkable physical exam,  is  recovering well.   Plan for discharge tomorrow 1. Plans to bottle feed and supplement with formula 2. Will receive depo-provera in hospital    LOS: 1 day   Chika I Chukwu MS3 01/29/2018, 8:54 AM

## 2018-01-30 MED ORDER — IBUPROFEN 600 MG PO TABS: 600.0000 mg | ORAL_TABLET | Freq: Four times a day (QID) | ORAL | 0 refills | Status: DC

## 2018-01-30 NOTE — Discharge Summary (Signed)
OB Discharge Summary     Patient Name: Cheryl Maynard DOB: September 20, 2000 MRN: [id number]  Date of admission: 01/28/2018 Delivering MD: Lenor CoffinLSON, Henreitta Spittler K   Date of discharge: 01/30/2018  Admitting diagnosis: 40wks, contractions and leaking Intrauterine pregnancy: 7461w6d     Secondary diagnosis:  Active Problems:   Normal labor   SVD (spontaneous vaginal delivery)  Additional problems: none     Discharge diagnosis: Term Pregnancy Delivered                                                                                                Post partum procedures:none  Augmentation: Pitocin  Complications: None  Hospital course:  Onset of Labor With Vaginal Delivery     17 y.o. yo G1P1001 at 861w6d was admitted in Active Labor on 01/28/2018. Patient had an uncomplicated labor course as follows:  Membrane Rupture Time/Date: 3:00 PM ,01/27/2018   Intrapartum Procedures: Episiotomy: None [1]                                         Lacerations:  Vaginal [6];Labial [10]  Patient had a delivery of a Viable infant. 01/28/2018  Information for the patient's newborn:  Cheryl Maynard, Cheryl Maynard [[id number]][[id number]]       Pateint had an uncomplicated postpartum course.  She is ambulating, tolerating a regular diet, passing flatus, and urinating well. Patient is discharged home in stable condition on 01/30/18.   Physical exam  Vitals:   01/29/18 0533 01/29/18 1416 01/29/18 2242 01/30/18 0601  BP: 110/71 131/84 125/84 122/79  Pulse: 71 79 77 78  Resp:  18 18 18   Temp: 98.3 F (36.8 C) 98.4 F (36.9 C) 98 F (36.7 C) 97.9 F (36.6 C)  TempSrc: Oral Oral    SpO2: 97% 97%    Weight:      Height:       General: alert, cooperative and no distress Lochia: appropriate Uterine Fundus: firm Incision: N/A DVT Evaluation: No evidence of DVT seen on physical exam. Labs: Lab Results  Component Value Date   WBC 7.9 01/28/2018   HGB 13.2 01/28/2018   HCT 38.2 01/28/2018   MCV 88.2 01/28/2018    PLT 327 01/28/2018   No flowsheet data found.  Discharge instruction: per After Visit Summary and "Baby and Me Booklet".  After visit meds:  Allergies as of 01/30/2018   No Known Allergies     Medication List    STOP taking these medications   PRENATAL VITAMIN PO     TAKE these medications   ibuprofen 600 MG tablet Commonly known as:  ADVIL,MOTRIN Take 1 tablet (600 mg total) by mouth every 6 (six) hours.       Diet: routine diet  Activity: Advance as tolerated. Pelvic rest for 6 weeks.   Outpatient follow up:6 weeks Follow up Appt:No future appointments. Follow up Visit:No follow-ups on file.  Postpartum contraception: Depo Provera  Newborn Data: Live born female  Birth Weight: 6 lb 3.6 oz (2824 g) APGAR: 6,  9  Newborn Delivery   Birth date/time:  01/28/2018 14:09:00 Delivery type:  Vaginal, Spontaneous     Baby Feeding: Breast Disposition:home with mother   01/30/2018 Sandre Kitty, MD

## 2018-01-30 NOTE — Lactation Note (Signed)
This note was copied from a baby's chart. Lactation Consultation Note  Patient Name: Cheryl Maynard [id number] Date: 01/30/2018 Reason for consult: Follow-up assessment;Infant weight loss;1st time breastfeeding;Primapara;Term(2% weight loss/ breast / formula ( moms preference ) )  Baby is 1 hours old  LC reviewed and updated the doc flow sheets per mom.  Mom has been mostly breast feeding and as LC entered the room, baby latched in cross cradle,  And mom comfortable. Mom denies soreness and breast are filling.  Depth noted and swallows.  Sore nipple and engorgement prevention and tx reviewed.  Mom has hand pump/ and the DEBP kit/ active with [facility] as a resource.  LC discussed nutritive vs non - nutritive feeding patterns and the importance of STS  Feedings until the baby can stay awake for feedings.  Also supply and demand. When milk is in if to full / hand express, or pre-pump with hand pump  Off to create a sandwich with tissue and LATCH with breast compressions until swallows.  Offer 2nd breast / if the baby doesn't take it , to release to comfort and next feeding offer than breast.  Mother informed of post-discharge support and given phone number to the lactation department, including services for phone call assistance; out-patient appointments; and breastfeeding support group. List of other breastfeeding resources in the community given in the handout. Encouraged mother to call for problems or concerns related to breastfeeding.    Maternal Data Has patient been taught Hand Expression?: Yes(per mom feels comfortable )  Feeding Feeding Type: (baby latched with depth ) Length of feed: 20 min(LC obs baby latched with depth/ swallows )  LATCH Score Latch: (latched with depth / cross cradle )  Audible Swallowing: (swallows noted )     Comfort (Breast/Nipple): (per mom comfortable )  Hold (Positioning): (mom independent with latch )      Interventions Interventions: Breast feeding basics reviewed;Hand pump  Lactation Tools Discussed/Used Tools: Pump Breast pump type: Manual   Consult Status Consult Status: Complete Date: 01/30/18    Jerlyn Ly Falesha Schommer 01/30/2018, 11:03 AM

## 2018-01-30 NOTE — Discharge Instructions (Signed)
Vaginal Delivery, Care After °Refer to this sheet in the next few weeks. These instructions provide you with information about caring for yourself after vaginal delivery. Your health care provider may also give you more specific instructions. Your treatment has been planned according to current medical practices, but problems sometimes occur. Call your health care provider if you have any problems or questions. °What can I expect after the procedure? °After vaginal delivery, it is common to have: °· Some bleeding from your vagina. °· Soreness in your abdomen, your vagina, and the area of skin between your vaginal opening and your anus (perineum). °· Pelvic cramps. °· Fatigue. ° °Follow these instructions at home: °Medicines °· Take over-the-counter and prescription medicines only as told by your health care provider. °· If you were prescribed an antibiotic medicine, take it as told by your health care provider. Do not stop taking the antibiotic until it is finished. °Driving ° °· Do not drive or operate heavy machinery while taking prescription pain medicine. °· Do not drive for 24 hours if you received a sedative. °Lifestyle °· Do not drink alcohol. This is especially important if you are breastfeeding or taking medicine to relieve pain. °· Do not use tobacco products, including cigarettes, chewing tobacco, or e-cigarettes. If you need help quitting, ask your health care provider. °Eating and drinking °· Drink at least 8 eight-ounce glasses of water every day unless you are told not to by your health care provider. If you choose to breastfeed your baby, you may need to drink more water than this. °· Eat high-fiber foods every day. These foods may help prevent or relieve constipation. High-fiber foods include: °? Whole grain cereals and breads. °? Brown rice. °? Beans. °? Fresh fruits and vegetables. °Activity °· Return to your normal activities as told by your health care provider. Ask your health care provider  what activities are safe for you. °· Rest as much as possible. Try to rest or take a nap when your baby is sleeping. °· Do not lift anything that is heavier than your baby or 10 lb (4.5 kg) until your health care provider says that it is safe. °· Talk with your health care provider about when you can engage in sexual activity. This may depend on your: °? Risk of infection. °? Rate of healing. °? Comfort and desire to engage in sexual activity. °Vaginal Care °· If you have an episiotomy or a vaginal tear, check the area every day for signs of infection. Check for: °? More redness, swelling, or pain. °? More fluid or blood. °? Warmth. °? Pus or a bad smell. °· Do not use tampons or douches until your health care provider says this is safe. °· Watch for any blood clots that may pass from your vagina. These may look like clumps of dark red, brown, or black discharge. °General instructions °· Keep your perineum clean and dry as told by your health care provider. °· Wear loose, comfortable clothing. °· Wipe from front to back when you use the toilet. °· Ask your health care provider if you can shower or take a bath. If you had an episiotomy or a perineal tear during labor and delivery, your health care provider may tell you not to take baths for a certain length of time. °· Wear a bra that supports your breasts and fits you well. °· If possible, have someone help you with household activities and help care for your baby for at least a few days after   you leave the hospital. °· Keep all follow-up visits for you and your baby as told by your health care provider. This is important. °Contact a health care provider if: °· You have: °? Vaginal discharge that has a bad smell. °? Difficulty urinating. °? Pain when urinating. °? A sudden increase or decrease in the frequency of your bowel movements. °? More redness, swelling, or pain around your episiotomy or vaginal tear. °? More fluid or blood coming from your episiotomy or  vaginal tear. °? Pus or a bad smell coming from your episiotomy or vaginal tear. °? A fever. °? A rash. °? Little or no interest in activities you used to enjoy. °? Questions about caring for yourself or your baby. °· Your episiotomy or vaginal tear feels warm to the touch. °· Your episiotomy or vaginal tear is separating or does not appear to be healing. °· Your breasts are painful, hard, or turn red. °· You feel unusually sad or worried. °· You feel nauseous or you vomit. °· You pass large blood clots from your vagina. If you pass a blood clot from your vagina, save it to show to your health care provider. Do not flush blood clots down the toilet without having your health care provider look at them. °· You urinate more than usual. °· You are dizzy or light-headed. °· You have not breastfed at all and you have not had a menstrual period for 12 weeks after delivery. °· You have stopped breastfeeding and you have not had a menstrual period for 12 weeks after you stopped breastfeeding. °Get help right away if: °· You have: °? Pain that does not go away or does not get better with medicine. °? Chest pain. °? Difficulty breathing. °? Blurred vision or spots in your vision. °? Thoughts about hurting yourself or your baby. °· You develop pain in your abdomen or in one of your legs. °· You develop a severe headache. °· You faint. °· You bleed from your vagina so much that you fill two sanitary pads in one hour. °This information is not intended to replace advice given to you by your health care provider. Make sure you discuss any questions you have with your health care provider. °Document Released: 07/01/2000 Document Revised: 12/16/2015 Document Reviewed: 07/19/2015 °[employer]vier Interactive Patient Education © 2018 [employer] ° °

## 2020-07-09 ENCOUNTER — Other Ambulatory Visit: Payer: Self-pay

## 2020-07-09 ENCOUNTER — Ambulatory Visit
Admission: EM | Admit: 2020-07-09 | Discharge: 2020-07-09 | Disposition: A | Discharge status: Home / Self Care | Payer: Medicaid Other | Attending: Emergency Medicine | Admitting: Emergency Medicine

## 2020-07-09 DIAGNOSIS — H00015 Hordeolum externum left lower eyelid: Secondary | ICD-10-CM | POA: Diagnosis not present

## 2020-07-09 MED ORDER — POLYMYXIN B-TRIMETHOPRIM 10000-0.1 UNIT/ML-% OP SOLN: 1.0000 [drp] | OPHTHALMIC | 0 refills | Status: DC

## 2020-07-09 NOTE — ED Provider Notes (Signed)
[facility]    CSN: [id number] Arrival date & time: 07/09/20  1158      History   Chief Complaint Chief Complaint  Patient presents with  . Eye Pain    X 3 days    HPI Cheryl Maynard is a 19 y.o. female  Presenting for left lower eye pain and swelling x3 days.  Denies trauma, injury, contact lens use.  No visual changes, photophobia, foreign body exposure sensation.  Has not tried nothing for this.  Past Medical History:  Diagnosis Date  . Medical history non-contributory     Patient Active Problem List   Diagnosis Date Noted  . Normal labor 01/28/2018  . SVD (spontaneous vaginal delivery) 01/28/2018  . Susceptible to Varicella (non-immune), currently pregnant in third trimester 01/26/2018  . Encounter for supervision of normal first pregnancy in third trimester 01/26/2018  . Chlamydia infection affecting pregnancy in first trimester 01/26/2018  . Supervision of normal first teen pregnancy 01/26/2018    Past Surgical History:  Procedure Laterality Date  . NO PAST SURGERIES      OB History    Gravida  2   Para  1   Term  1   Preterm      AB      Living  1     SAB      IAB      Ectopic      Multiple  0   Live Births  1            Home Medications    Prior to Admission medications   Medication Sig Start Date End Date Taking? Authorizing Provider  ibuprofen (ADVIL,MOTRIN) 600 MG tablet Take 1 tablet (600 mg total) by mouth every 6 (six) hours. 01/30/18   Sandre Kitty, MD  trimethoprim-polymyxin b Joaquim Lai) ophthalmic solution Place 1 drop into the left eye every 4 (four) hours. 07/09/20   Hall-Potvin, [place], PA-C    Family History Family History  Problem Relation Age of Onset  . Anemia Mother   . ADD / ADHD Neg Hx   . Alcohol abuse Neg Hx   . Anxiety disorder Neg Hx   . Asthma Neg Hx   . Arthritis Neg Hx   . Birth defects Neg Hx   . Cancer Neg Hx   . COPD Neg Hx   . Depression Neg Hx   . Diabetes Neg  Hx   . Drug abuse Neg Hx   . Hearing loss Neg Hx   . Early death Neg Hx   . Heart disease Neg Hx   . Hyperlipidemia Neg Hx   . Hypertension Neg Hx   . Intellectual disability Neg Hx   . Kidney disease Neg Hx   . Learning disabilities Neg Hx   . Miscarriages / Stillbirths Neg Hx   . Obesity Neg Hx   . Stroke Neg Hx   . Vision loss Neg Hx   . Varicose Veins Neg Hx     Social History Social History   Tobacco Use  . Smoking status: Former Smoker    Types: Cigarettes  . Smokeless tobacco: Never Used  . Tobacco comment: quit with +preg test  Vaping Use  . Vaping Use: Never used  Substance Use Topics  . Alcohol use: No  . Drug use: No     Allergies   Patient has no known allergies.   Review of Systems Review of Systems  Constitutional: Negative for fatigue and fever.  HENT: Negative  for ear pain, sinus pain, sore throat and voice change.   Eyes: Positive for pain. Negative for redness and visual disturbance.  Respiratory: Negative for cough and shortness of breath.   Cardiovascular: Negative for chest pain and palpitations.  Gastrointestinal: Negative for abdominal pain, diarrhea and vomiting.  Musculoskeletal: Negative for arthralgias and myalgias.  Skin: Negative for rash and wound.  Neurological: Negative for syncope and headaches.     Physical Exam Triage Vital Signs ED Triage Vitals  Enc Vitals Group     BP 07/09/20 1232 112/74     Pulse Rate 07/09/20 1232 87     Resp --      Temp 07/09/20 1232 98 F (36.7 C)     Temp Source 07/09/20 1232 Oral     SpO2 07/09/20 1232 99 %     Weight --      Height --      Head Circumference --      Peak Flow --      Pain Score 07/09/20 1234 5     Pain Loc --      Pain Edu? --      Excl. in GC? --    No data found.  Updated Vital Signs BP 112/74 (BP Location: Right Arm)   Pulse 87   Temp 98 F (36.7 C) (Oral)   SpO2 99%   Visual Acuity Right Eye Distance:   Left Eye Distance:   Bilateral Distance:     Right Eye Near:   Left Eye Near:    Bilateral Near:     Physical Exam Constitutional:      General: She is not in acute distress. HENT:     Head: Normocephalic and atraumatic.  Eyes:     General: No scleral icterus.       Right eye: No discharge.        Left eye: No discharge.     Extraocular Movements: Extraocular movements intact.     Conjunctiva/sclera: Conjunctivae normal.     Pupils: Pupils are equal, round, and reactive to light.     Comments: Left lower eyelid with medial stye.  No punctate, discharge.  Cardiovascular:     Rate and Rhythm: Normal rate.  Pulmonary:     Effort: Pulmonary effort is normal.  Skin:    Coloration: Skin is not jaundiced or pale.  Neurological:     Mental Status: She is alert and oriented to person, place, and time.      UC Treatments / Results  Labs (all labs ordered are listed, but only abnormal results are displayed) Labs Reviewed - No data to display  EKG   Radiology No results found.  Procedures Procedures (including critical care time)  Medications Ordered in UC Medications - No data to display  Initial Impression / Assessment and Plan / UC Course  I have reviewed the triage vital signs and the nursing notes.  Pertinent labs & imaging results that were available during my care of the patient were reviewed by me and considered in my medical decision making (see chart for details).     H&P consistent with left lower eyelid stye.  Reviewed supportive care as below.  Return precautions discussed, pt verbalized understanding and is agreeable to plan. Final Clinical Impressions(s) / UC Diagnoses   Final diagnoses:  Hordeolum externum of left lower eyelid   Discharge Instructions   None    ED Prescriptions    Medication Sig Dispense Auth. Provider   trimethoprim-polymyxin b (POLYTRIM)  ophthalmic solution Place 1 drop into the left eye every 4 (four) hours. 10 mL Hall-Potvin, [place], PA-C     PDMP not reviewed  this encounter.   Hall-Potvin, [place], [place] 07/09/20 1812

## 2020-07-09 NOTE — ED Triage Notes (Signed)
Patient states she has had left eye pain x 3 days and when she woke up this morning she noticed the white bump in the right corner of her left eye. Pt is aox4 and ambulatory.

## 2020-07-14 ENCOUNTER — Ambulatory Visit
Admission: EM | Admit: 2020-07-14 | Discharge: 2020-07-14 | Disposition: A | Discharge status: Home / Self Care | Payer: Medicaid Other | Attending: Emergency Medicine | Admitting: Emergency Medicine

## 2020-07-14 ENCOUNTER — Other Ambulatory Visit: Payer: Self-pay

## 2020-07-14 DIAGNOSIS — J069 Acute upper respiratory infection, unspecified: Secondary | ICD-10-CM | POA: Diagnosis not present

## 2020-07-14 DIAGNOSIS — Z20822 Contact with and (suspected) exposure to covid-19: Secondary | ICD-10-CM | POA: Diagnosis not present

## 2020-07-14 MED ORDER — FLUTICASONE PROPIONATE 50 MCG/ACT NA SUSP: 1.0000 | Freq: Every day | NASAL | 0 refills | Status: DC

## 2020-07-14 MED ORDER — ALBUTEROL SULFATE HFA 108 (90 BASE) MCG/ACT IN AERS: 2.0000 | INHALATION_SPRAY | Freq: Four times a day (QID) | RESPIRATORY_TRACT | 2 refills | Status: DC | PRN

## 2020-07-14 NOTE — ED Provider Notes (Signed)
[facility]    CSN: [id number] Arrival date & time: 07/14/20  1736      History   Chief Complaint Chief Complaint  Patient presents with  . Cough    HPI Cheryl Maynard is a 19 y.o. female  Presenting for frontal headache x3 days.  Also endorsing weeklong course of mildly productive cough with nasal congestion.  States most bothersome symptom is nasal congestion.  Taking Sudafed with some relief.  No known sick contacts.  Denies chest pain or difficulty breathing.  Currently [redacted] weeks gestation.  Past Medical History:  Diagnosis Date  . Medical history non-contributory     Patient Active Problem List   Diagnosis Date Noted  . Normal labor 01/28/2018  . SVD (spontaneous vaginal delivery) 01/28/2018  . Susceptible to Varicella (non-immune), currently pregnant in third trimester 01/26/2018  . Encounter for supervision of normal first pregnancy in third trimester 01/26/2018  . Chlamydia infection affecting pregnancy in first trimester 01/26/2018  . Supervision of normal first teen pregnancy 01/26/2018    Past Surgical History:  Procedure Laterality Date  . NO PAST SURGERIES      OB History    Gravida  2   Para  1   Term  1   Preterm      AB      Living  1     SAB      IAB      Ectopic      Multiple  0   Live Births  1            Home Medications    Prior to Admission medications   Medication Sig Start Date End Date Taking? Authorizing Provider  albuterol (VENTOLIN HFA) 108 (90 Base) MCG/ACT inhaler Inhale 2 puffs into the lungs every 6 (six) hours as needed for wheezing or shortness of breath. 07/14/20  Yes Hall-Potvin, [place], PA-C  fluticasone (FLONASE) 50 MCG/ACT nasal spray Place 1 spray into both nostrils daily. 07/14/20  Yes Hall-Potvin, [place], PA-C  ibuprofen (ADVIL,MOTRIN) 600 MG tablet Take 1 tablet (600 mg total) by mouth every 6 (six) hours. 01/30/18   Sandre Kitty, MD  trimethoprim-polymyxin b Joaquim Lai)  ophthalmic solution Place 1 drop into the left eye every 4 (four) hours. 07/09/20   Hall-Potvin, [place], PA-C    Family History Family History  Problem Relation Age of Onset  . Anemia Mother   . ADD / ADHD Neg Hx   . Alcohol abuse Neg Hx   . Anxiety disorder Neg Hx   . Asthma Neg Hx   . Arthritis Neg Hx   . Birth defects Neg Hx   . Cancer Neg Hx   . COPD Neg Hx   . Depression Neg Hx   . Diabetes Neg Hx   . Drug abuse Neg Hx   . Hearing loss Neg Hx   . Early death Neg Hx   . Heart disease Neg Hx   . Hyperlipidemia Neg Hx   . Hypertension Neg Hx   . Intellectual disability Neg Hx   . Kidney disease Neg Hx   . Learning disabilities Neg Hx   . Miscarriages / Stillbirths Neg Hx   . Obesity Neg Hx   . Stroke Neg Hx   . Vision loss Neg Hx   . Varicose Veins Neg Hx     Social History Social History   Tobacco Use  . Smoking status: Former Smoker    Types: Cigarettes  . Smokeless tobacco: Never  Used  . Tobacco comment: quit with +preg test  Vaping Use  . Vaping Use: Never used  Substance Use Topics  . Alcohol use: No  . Drug use: No     Allergies   Patient has no known allergies.   Review of Systems Review of Systems  Constitutional: Negative for fatigue and fever.  HENT: Positive for congestion. Negative for dental problem, ear pain, facial swelling, hearing loss, sinus pain, sore throat, trouble swallowing and voice change.   Eyes: Negative for photophobia, pain and visual disturbance.  Respiratory: Positive for cough. Negative for shortness of breath.   Cardiovascular: Negative for chest pain and palpitations.  Gastrointestinal: Negative for diarrhea and vomiting.  Musculoskeletal: Negative for arthralgias and myalgias.  Neurological: Positive for headaches. Negative for dizziness.     Physical Exam Triage Vital Signs ED Triage Vitals [07/14/20 2030]  Enc Vitals Group     BP 120/78     Pulse Rate 88     Resp 16     Temp 98.2 F (36.8 C)     Temp  Source Oral     SpO2 98 %     Weight      Height      Head Circumference      Peak Flow      Pain Score      Pain Loc      Pain Edu?      Excl. in GC?    No data found.  Updated Vital Signs BP 120/78 (BP Location: Left Arm)   Pulse 88   Temp 98.2 F (36.8 C) (Oral)   Resp 16   SpO2 98%   Breastfeeding No   Visual Acuity Right Eye Distance:   Left Eye Distance:   Bilateral Distance:    Right Eye Near:   Left Eye Near:    Bilateral Near:     Physical Exam Constitutional:      General: She is not in acute distress.    Appearance: She is not ill-appearing or diaphoretic.  HENT:     Head: Normocephalic and atraumatic.     Right Ear: Tympanic membrane and ear canal normal.     Left Ear: Tympanic membrane and ear canal normal.     Mouth/Throat:     Mouth: Mucous membranes are moist.     Pharynx: Oropharynx is clear. No oropharyngeal exudate or posterior oropharyngeal erythema.  Eyes:     General: No scleral icterus.    Conjunctiva/sclera: Conjunctivae normal.     Pupils: Pupils are equal, round, and reactive to light.  Neck:     Comments: Trachea midline, negative JVD Cardiovascular:     Rate and Rhythm: Normal rate and regular rhythm.     Heart sounds: No murmur heard. No gallop.   Pulmonary:     Effort: Pulmonary effort is normal. No respiratory distress.     Breath sounds: No wheezing, rhonchi or rales.  Musculoskeletal:     Cervical back: Neck supple. No tenderness.  Lymphadenopathy:     Cervical: No cervical adenopathy.  Skin:    Capillary Refill: Capillary refill takes less than 2 seconds.     Coloration: Skin is not jaundiced or pale.     Findings: No rash.  Neurological:     General: No focal deficit present.     Mental Status: She is alert and oriented to person, place, and time.      UC Treatments / Results  Labs (all labs ordered are listed,  but only abnormal results are displayed) Labs Reviewed  NOVEL CORONAVIRUS, NAA     EKG   Radiology No results found.  Procedures Procedures (including critical care time)  Medications Ordered in UC Medications - No data to display  Initial Impression / Assessment and Plan / UC Course  I have reviewed the triage vital signs and the nursing notes.  Pertinent labs & imaging results that were available during my care of the patient were reviewed by me and considered in my medical decision making (see chart for details).     Patient afebrile, nontoxic, with SpO2 98%.  Covid PCR pending.  Patient to quarantine until results are back.  We will treat supportively as outlined below.  Return precautions discussed, patient verbalized understanding and is agreeable to plan. Final Clinical Impressions(s) / UC Diagnoses   Final diagnoses:  Encounter for screening laboratory testing for COVID-19 virus  URI with cough and congestion     Discharge Instructions     Start flonase, atrovent nasal spray for nasal congestion/drainage. You can use over the counter nasal saline rinse such as neti pot for nasal congestion. Keep hydrated, your urine should be clear to pale yellow in color. Tylenol/motrin for fever and pain. Monitor for any worsening of symptoms, chest pain, shortness of breath, wheezing, swelling of the throat, go to the emergency department for further evaluation needed.     ED Prescriptions    Medication Sig Dispense Auth. Provider   fluticasone (FLONASE) 50 MCG/ACT nasal spray Place 1 spray into both nostrils daily. 16 g Hall-Potvin, [place], PA-C   albuterol (VENTOLIN HFA) 108 (90 Base) MCG/ACT inhaler Inhale 2 puffs into the lungs every 6 (six) hours as needed for wheezing or shortness of breath. 8 g Hall-Potvin, [place], PA-C     PDMP not reviewed this encounter.   Hall-Potvin, [place], [place] 07/14/20 2129

## 2020-07-14 NOTE — ED Triage Notes (Signed)
Pt c/o sever headache for 3 days. States also has had cough and congestion x1wk.

## 2020-07-14 NOTE — Discharge Instructions (Addendum)
Start flonase, atrovent nasal spray for nasal congestion/drainage. You can use over the counter nasal saline rinse such as neti pot for nasal congestion. Keep hydrated, your urine should be clear to pale yellow in color. Tylenol/motrin for fever and pain. Monitor for any worsening of symptoms, chest pain, shortness of breath, wheezing, swelling of the throat, go to the emergency department for further evaluation needed.  

## 2020-07-16 LAB — NOVEL CORONAVIRUS, NAA: SARS-CoV-2, NAA: NOT DETECTED

## 2020-07-16 LAB — SARS-COV-2, NAA 2 DAY TAT

## 2020-07-18 NOTE — L&D Delivery Note (Signed)
Delivery Note Progressed quickly to complete dilation.  AROM for clear fluid.  FHR Deceleration x 8 min to 70s Dr Alysia Penna and Peds team called  Kiwi vacuum applied and placement double-checked.  Suction obtained to green zone.  Two pulls were made with good descent under symphysis.  Vacuum removed and patient pushed rest of head out.  Shoulders were tight but delivered easily with McRoberts maneuver.  At 3:48 AM a viable and healthy female was delivered via Vaginal, Vacuum (Extractor) (Presentation: Right Occiput Anterior).  Baby cried immediately.  APGAR: 9, 9; weight  .   Placenta status: Spontaneous, Intact.  Cord: 3 vessels with the following complications: None.   Anesthesia: Epidural Episiotomy: None Lacerations: None Suture Repair:  none Est. Blood Loss (mL): 75  Mom to postpartum.  Baby to Couplet care / Skin to Skin.  Wynelle Bourgeois 01/06/2021, 4:04 AM

## 2020-08-25 ENCOUNTER — Other Ambulatory Visit: Payer: Self-pay

## 2020-08-31 ENCOUNTER — Other Ambulatory Visit: Payer: Self-pay | Admitting: Obstetrics & Gynecology

## 2020-08-31 DIAGNOSIS — Z363 Encounter for antenatal screening for malformations: Secondary | ICD-10-CM

## 2020-09-16 ENCOUNTER — Encounter: Payer: Self-pay | Admitting: *Deleted

## 2020-09-22 ENCOUNTER — Encounter: Payer: Self-pay | Admitting: *Deleted

## 2020-09-22 ENCOUNTER — Other Ambulatory Visit: Payer: Self-pay

## 2020-09-22 ENCOUNTER — Ambulatory Visit: Payer: Medicaid Other | Attending: Obstetrics & Gynecology

## 2020-09-22 ENCOUNTER — Ambulatory Visit: Payer: Medicaid Other | Admitting: *Deleted

## 2020-09-22 VITALS — BP 119/65 | HR 74

## 2020-09-22 DIAGNOSIS — Z363 Encounter for antenatal screening for malformations: Secondary | ICD-10-CM | POA: Diagnosis present

## 2020-09-22 DIAGNOSIS — Z3A24 24 weeks gestation of pregnancy: Secondary | ICD-10-CM | POA: Diagnosis not present

## 2020-09-22 DIAGNOSIS — R772 Abnormality of alphafetoprotein: Secondary | ICD-10-CM | POA: Insufficient documentation

## 2020-09-22 DIAGNOSIS — O98213 Gonorrhea complicating pregnancy, third trimester: Secondary | ICD-10-CM

## 2020-09-22 DIAGNOSIS — A749 Chlamydial infection, unspecified: Secondary | ICD-10-CM | POA: Diagnosis not present

## 2020-09-23 ENCOUNTER — Other Ambulatory Visit: Payer: Self-pay | Admitting: *Deleted

## 2020-09-23 DIAGNOSIS — O280 Abnormal hematological finding on antenatal screening of mother: Secondary | ICD-10-CM

## 2020-09-28 ENCOUNTER — Ambulatory Visit: Payer: Medicaid Other

## 2020-09-28 ENCOUNTER — Ambulatory Visit: Payer: Medicaid Other | Attending: Obstetrics | Admitting: Genetic Counselor

## 2020-09-28 ENCOUNTER — Ambulatory Visit: Payer: Self-pay | Admitting: Genetic Counselor

## 2020-09-28 ENCOUNTER — Other Ambulatory Visit: Payer: Self-pay

## 2020-09-28 DIAGNOSIS — Z315 Encounter for genetic counseling: Secondary | ICD-10-CM | POA: Insufficient documentation

## 2020-09-28 DIAGNOSIS — R772 Abnormality of alphafetoprotein: Secondary | ICD-10-CM

## 2020-09-28 DIAGNOSIS — O281 Abnormal biochemical finding on antenatal screening of mother: Secondary | ICD-10-CM | POA: Diagnosis not present

## 2020-09-28 NOTE — Progress Notes (Addendum)
ADDENDUM 10/14/20: I spoke with [name] regarding the usefulness of assessing AFP on an amniotic fluid sample in light of the discussion I had with Doreatha Massed yesterday. We reviewed that at her gestational age, an AF-AFP result may not clarify the risk for an ONTD in the pregnancy. Since [name] was mostly interested in having an amniocentesis due to her borderline elevated MS-AFP result, she elected to instead follow the pregnancy with ultrasound for now. I encouraged her to contact me if she continues to have some fears/anxieties following her ultrasound next week and would like to talk through her options again.  ----------------------------------------------------------------------------------------------------------------------   ADDENDUM 10/13/20: I spoke with Doreatha Massed, genetic counselor at [facility], who informed me that AF-AFP analysis on an amniocentesis sample would be uninformative given [name] advanced gestational age. I will contact [name] with this update and see whether or not she still wants to have an amniocentesis at her next visit.  -----------------------------------------------------------------------------------------------------------------------  09/28/2020  Cheryl Maynard, Cheryl Maynard MRN: [id number] DOV: 09/28/2020  Cheryl Maynard presented to the [facility] for a genetics consultation regarding her borderline increased MS-AFP results. Cheryl Maynard presented to her appointment alone. She was offered and declined an interpreter during today's session.  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 female. Her current pregnancy has completed [redacted]w[redacted]d (Estimated Date of Delivery: 01/06/21). Cheryl Maynard and her partner have a three year old daughter together. Cheryl Maynard is currently  attending [employer] and is studying education and nursing. She will be moving to [facility] in the future.  Ms. Lafosse denied exposure to environmental toxins or chemical agents. She denied the use of alcohol, tobacco or street drugs. She reported taking prenatal vitamins. She denied significant bleeding during the course of her pregnancy. She was sick for approximately 3 weeks around 12 weeks' gestation but tested negative for COVID. Her medical and surgical histories were noncontributory.  Family History  A three generation pedigree was drafted and reviewed. The family history is remarkable for the following:  - Cheryl Maynard sister was born with a "hole in her heart". She was reportedly born premature. We discussed that some congenital heart defects (specifically patent ductus arteriosus) are common in premature babies.   - Cheryl Maynard had a brother who was born prematurely and died. She did not have further details about him.  The remaining family histories were reviewed and found to be noncontributory for birth defects, intellectual disability, recurrent pregnancy loss, and known genetic conditions. Cheryl Maynard had limited information about portions of her and her partner's family histories; thus, risk assessment was limited.  The patient's ancestry is [place]. The father of the pregnancy's ancestry is [place]. Ashkenazi Jewish ancestry and consanguinity were denied. Pedigree will be scanned under Media.  Discussion  MS-AFP results:  Cheryl Maynard was referred to genetic counseling for a borderline increased AFP result on her quad screen. Results of the screen indicated that the AFP level in Cheryl Maynard sample was slightly elevated at 2.17 MoM. A risk for open neural tube defects (ONTDs) in the pregnancy was not provided.   We reviewed these MS-AFP results in detail. We discussed that there are many explanations for an elevated AFP result, including  twin pregnancies, dating errors, ONTDs such as anencephaly or spina bifida, abdominal wall defects, placental abnormalities, fetal growth restriction, adverse obstetrical outcomes (oligohydramnios, placental abruption, intrauterine fetal demise/stillbirth, preterm delivery, premature rupture of  membranes, or preeclampsia), or a false positive. Cheryl Maynard had her anatomy ultrasound performed last week. The ultrasound report was sent under separate cover. There were no visualized fetal anomalies or markers suggestive of aneuploidy. Anatomy ultrasound also did not detect twins, ONTDs, abdominal wall defects, placental abnormalities, or oligohydramnios. Fetal growth was appropriate for her gestational age, so a dating error is not likely to be the cause for the elevated AFP result.   Cheryl Maynard was counseled that In light of a normal anatomy ultrasound, it is possible that an explanation may not be found for her elevated MS-AFP result. Given the elevated AFP level, the pregnancy will be monitored more closely for fetal growth restriction, preeclampsia, and other possible adverse obstetrical outcomes.  Aneuploidy screening result:  The remainder of Cheryl Maynard quad screen was negative. We reviewed that the risk for her pregnancy to be affected by Down syndrome decreased from her 1 in 1495 age-related risk to less than 1 in 10,000, and the risk for trisomy 18 decreased from her 1 in 65 age-related risk to less than 1 in 10,000 based on the results of this screen. Cheryl Maynard was counseled that while this screen significantly reduces the likelihood of the pregnancy being affected by trisomy 52 or trisomy 38, it cannot be considered diagnostic. Quad screening can detect 75-80% of pregnancies affected by Down syndrome and 60-65% of pregnancies affected by trisomy 10; thus, some cases of these conditions can be missed. She also understands that quad screening does not assess for all  possible genetic conditions.  Carrier screening  Per ACOG recommendation, carrier screening for hemoglobinopathies, cystic fibrosis (CF) and spinal muscular atrophy (SMA) was discussed including information about the conditions, rationale for testing, autosomal recessive inheritance, and the option of prenatal diagnosis. I offered carrier screening for these conditions, which Cheryl Maynard accepted at this time. She understands that partner carrier screening will be recommended should she screen positive for any of these conditions. Ms. Jain was informed that select hemoglobinopathies, CF, and SMA are included on [employer]'s newborn screen.   Diagnostic testing:  Ms. Rehmann was also counseled regarding diagnostic testing via amniocentesis. We discussed the technical aspects of the procedure and quoted up to a 1 in 500 (0.2%) risk for spontaneous pregnancy loss or other adverse pregnancy outcomes as a result of amniocentesis. Cultured cells from an amniocentesis sample allow for the visualization of a fetal karyotype, which can detect >99% of large chromosomal aberrations. Chromosomal microarray can also be performed to identify smaller deletions or duplications of fetal chromosomal material. Amniocentesis could also be performed to assess whether the baby is affected by an ONTD via AF-AFP and acetylcholinesterase analysis.   We discussed that while ultrasound is able to detect ONTDs with 90-95% sensitivity, amniocentesis is able to detect them with 95-98% sensitivity. A negative AF-AFP result in the context of a normal ultrasound would significantly decrease the chance of the fetus having an ONTD; however, rare cases may still be missed by ultrasound/amniocentesis. Ms. Ginty informed me that she has been considering an amniocentesis since her initial discussion with Dr. Parke Poisson last week and has decided that she would like to have one done following her next ultrasound on  4/5. She is feeling anxious about her MS-AFP result and is hoping that results from an amniocentesis may provide her with some peace of mind.    Plan:  Ms. Korol is interested in having an amniocentesis performed at the time of her next ultrasound given that she still  has some anxiety over her screening results. We will schedule her for this procedure following her 4/5 ultrasound.  Ms. Brisbon also had a sample drawn for CF, SMA, and hemoglobinopathy carrier screening through [facility] today. Results will take 2-3 weeks to be returned. I will call Ms. Philyaw once her results become available. I can help facilitate partner carrier screening should her results come back positive.   I counseled Ms. Fanguy regarding the above risks and available options. The approximate face-to-face time with the genetic counselor was 40 minutes.  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 We will schedule this  Reviewed family history concerns   Gershon Crane, MS, [facility] Genetic Counselor

## 2020-10-13 ENCOUNTER — Telehealth: Payer: Self-pay | Admitting: Obstetrics and Gynecology

## 2020-10-13 NOTE — Telephone Encounter (Signed)
Cheryl Maynard was informed of her negative carrier screening results by Pricilla Handler, genetic counseling intern, as follows:  SMA is a recessive genetic condition with variable age of onset and severity caused by mutations in the SMN1 gene.  This carrier testing assesses the number of copies of this gene.  Persons with one copy of the SMN1 gene are carriers, and those with no copies are affected with the condition.  Individuals with two or more copies have a reduced chance to be a carrier.  Not all mutations can be detected with this testing, though it can detect 94.8% of carriers in the Caucasian population.  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 *3+80T>G SNP, thus reducing her chance to be a carrier from 1 in 87 to 1 in 784 based upon her Hispanic ancestry.  Again, this testing cannot eliminate the chance to have a child with SMA, but dramatically reduces the chance.    CF is a genetic condition that occurs most often in Caucasian persons.  It primarily affects the lungs, digestive, and reproductive systems.  For someone to be at risk for having CF, both of their parents must be carriers for CF.  The testing can detect many persons who are carriers for CF and therefore determine if the pregnancy is at an increased risk for this condition.  The blood test results were negative when examined for DNA sequences in the CFTR gene.  This testing detects >99% of carriers who are Caucasian.  The chance that she is a carrier based on this negative result has been reduced from 1 in 45  to approximately 1 in 4,400 due to her Hispanic ancestry.  Because this testing cannot detect all changes that may cause CF, we cannot eliminate the chance that this individual is a carrier completely.  We encouraged the patient to call with any questions or concerns as they arise.  We may be reached at [phone].  Cheryl Anderson, MS, CGC

## 2020-10-20 ENCOUNTER — Other Ambulatory Visit: Payer: Self-pay

## 2020-10-20 ENCOUNTER — Encounter: Payer: Self-pay | Admitting: *Deleted

## 2020-10-20 ENCOUNTER — Ambulatory Visit: Payer: Medicaid Other | Attending: Obstetrics

## 2020-10-20 ENCOUNTER — Ambulatory Visit: Payer: Medicaid Other | Admitting: *Deleted

## 2020-10-20 VITALS — BP 112/67 | HR 84

## 2020-10-20 DIAGNOSIS — Z3A28 28 weeks gestation of pregnancy: Secondary | ICD-10-CM | POA: Diagnosis not present

## 2020-10-20 DIAGNOSIS — O280 Abnormal hematological finding on antenatal screening of mother: Secondary | ICD-10-CM

## 2020-10-20 DIAGNOSIS — O98313 Other infections with a predominantly sexual mode of transmission complicating pregnancy, third trimester: Secondary | ICD-10-CM

## 2020-10-20 DIAGNOSIS — R772 Abnormality of alphafetoprotein: Secondary | ICD-10-CM

## 2020-10-20 DIAGNOSIS — A749 Chlamydial infection, unspecified: Secondary | ICD-10-CM | POA: Diagnosis not present

## 2020-10-20 DIAGNOSIS — Z363 Encounter for antenatal screening for malformations: Secondary | ICD-10-CM | POA: Diagnosis not present

## 2020-12-07 ENCOUNTER — Other Ambulatory Visit: Payer: Self-pay | Admitting: Family Medicine

## 2020-12-07 DIAGNOSIS — Z3A37 37 weeks gestation of pregnancy: Secondary | ICD-10-CM

## 2020-12-07 DIAGNOSIS — O26843 Uterine size-date discrepancy, third trimester: Secondary | ICD-10-CM

## 2020-12-07 DIAGNOSIS — Z363 Encounter for antenatal screening for malformations: Secondary | ICD-10-CM

## 2020-12-08 ENCOUNTER — Other Ambulatory Visit: Payer: Self-pay

## 2020-12-21 ENCOUNTER — Ambulatory Visit: Payer: Medicaid Other | Admitting: *Deleted

## 2020-12-21 ENCOUNTER — Ambulatory Visit: Payer: Medicaid Other | Attending: Obstetrics and Gynecology

## 2020-12-21 ENCOUNTER — Encounter: Payer: Self-pay | Admitting: *Deleted

## 2020-12-21 ENCOUNTER — Other Ambulatory Visit: Payer: Self-pay

## 2020-12-21 VITALS — BP 131/79 | HR 76

## 2020-12-21 DIAGNOSIS — O26843 Uterine size-date discrepancy, third trimester: Secondary | ICD-10-CM | POA: Insufficient documentation

## 2020-12-21 DIAGNOSIS — R772 Abnormality of alphafetoprotein: Secondary | ICD-10-CM

## 2020-12-21 DIAGNOSIS — A749 Chlamydial infection, unspecified: Secondary | ICD-10-CM | POA: Diagnosis not present

## 2020-12-21 DIAGNOSIS — O98313 Other infections with a predominantly sexual mode of transmission complicating pregnancy, third trimester: Secondary | ICD-10-CM | POA: Diagnosis not present

## 2020-12-21 DIAGNOSIS — Z3A37 37 weeks gestation of pregnancy: Secondary | ICD-10-CM | POA: Diagnosis present

## 2020-12-21 DIAGNOSIS — Z363 Encounter for antenatal screening for malformations: Secondary | ICD-10-CM | POA: Insufficient documentation

## 2021-01-05 ENCOUNTER — Other Ambulatory Visit: Payer: Self-pay

## 2021-01-05 ENCOUNTER — Encounter (HOSPITAL_COMMUNITY): Payer: Self-pay | Admitting: Obstetrics & Gynecology

## 2021-01-05 ENCOUNTER — Inpatient Hospital Stay (HOSPITAL_COMMUNITY)
Admission: AD | Admit: 2021-01-05 | Discharge: 2021-01-07 | DRG: 807 | Disposition: A | Discharge status: Home / Self Care | Payer: Medicaid Other | Attending: Obstetrics and Gynecology | Admitting: Obstetrics and Gynecology

## 2021-01-05 DIAGNOSIS — Z87891 Personal history of nicotine dependence: Secondary | ICD-10-CM | POA: Diagnosis not present

## 2021-01-05 DIAGNOSIS — Z3A40 40 weeks gestation of pregnancy: Secondary | ICD-10-CM | POA: Diagnosis not present

## 2021-01-05 DIAGNOSIS — O99824 Streptococcus B carrier state complicating childbirth: Secondary | ICD-10-CM | POA: Diagnosis present

## 2021-01-05 DIAGNOSIS — Z20822 Contact with and (suspected) exposure to covid-19: Secondary | ICD-10-CM | POA: Diagnosis present

## 2021-01-05 DIAGNOSIS — O133 Gestational [pregnancy-induced] hypertension without significant proteinuria, third trimester: Secondary | ICD-10-CM

## 2021-01-05 DIAGNOSIS — O99820 Streptococcus B carrier state complicating pregnancy: Secondary | ICD-10-CM | POA: Diagnosis not present

## 2021-01-05 DIAGNOSIS — Z3A39 39 weeks gestation of pregnancy: Secondary | ICD-10-CM

## 2021-01-05 DIAGNOSIS — O134 Gestational [pregnancy-induced] hypertension without significant proteinuria, complicating childbirth: Principal | ICD-10-CM | POA: Diagnosis present

## 2021-01-05 DIAGNOSIS — Z3490 Encounter for supervision of normal pregnancy, unspecified, unspecified trimester: Secondary | ICD-10-CM

## 2021-01-05 DIAGNOSIS — O139 Gestational [pregnancy-induced] hypertension without significant proteinuria, unspecified trimester: Secondary | ICD-10-CM | POA: Diagnosis not present

## 2021-01-05 LAB — URINALYSIS, ROUTINE W REFLEX MICROSCOPIC
Bilirubin Urine: NEGATIVE
Glucose, UA: NEGATIVE mg/dL
Hgb urine dipstick: NEGATIVE
Ketones, ur: NEGATIVE mg/dL
Leukocytes,Ua: NEGATIVE
Nitrite: NEGATIVE
Protein, ur: NEGATIVE mg/dL
Specific Gravity, Urine: 1.015 (ref 1.005–1.030)
pH: 7 (ref 5.0–8.0)

## 2021-01-05 LAB — COMPREHENSIVE METABOLIC PANEL
ALT: 20 U/L (ref 0–44)
AST: 24 U/L (ref 15–41)
Albumin: 2.9 g/dL — ABNORMAL LOW (ref 3.5–5.0)
Alkaline Phosphatase: 199 U/L — ABNORMAL HIGH (ref 38–126)
Anion gap: 8 (ref 5–15)
BUN: 9 mg/dL (ref 6–20)
CO2: 23 mmol/L (ref 22–32)
Calcium: 9.5 mg/dL (ref 8.9–10.3)
Chloride: 105 mmol/L (ref 98–111)
Creatinine, Ser: 0.47 mg/dL (ref 0.44–1.00)
GFR, Estimated: >60 mL/min (ref >60)
Glucose, Bld: 81 mg/dL (ref 70–99)
Potassium: 4 mmol/L (ref 3.5–5.1)
Sodium: 136 mmol/L (ref 135–145)
Total Bilirubin: 0.6 mg/dL (ref 0.3–1.2)
Total Protein: 6.4 g/dL — ABNORMAL LOW (ref 6.5–8.1)

## 2021-01-05 LAB — CBC
HCT: 38.7 % (ref 36.0–46.0)
Hemoglobin: 13.3 g/dL (ref 12.0–15.0)
MCH: 29.5 pg (ref 26.0–34.0)
MCHC: 34.4 g/dL (ref 30.0–36.0)
MCV: 85.8 fL (ref 80.0–100.0)
Platelets: 311 10*3/uL (ref 150–400)
RBC: 4.51 MIL/uL (ref 3.87–5.11)
RDW: 14 % (ref 11.5–15.5)
WBC: 12.1 10*3/uL — ABNORMAL HIGH (ref 4.0–10.5)
nRBC: 0 % (ref 0.0–0.2)

## 2021-01-05 LAB — TYPE AND SCREEN
ABO/RH(D): O POS
Antibody Screen: NEGATIVE

## 2021-01-05 LAB — PROTEIN / CREATININE RATIO, URINE
Creatinine, Urine: 62.93 mg/dL
Protein Creatinine Ratio: 0.14 mg/mg{Cre} (ref 0.00–0.15)
Total Protein, Urine: 9 mg/dL

## 2021-01-05 LAB — RESP PANEL BY RT-PCR (FLU A&B, COVID) ARPGX2
Influenza A by PCR: NEGATIVE
Influenza B by PCR: NEGATIVE
SARS Coronavirus 2 by RT PCR: NEGATIVE

## 2021-01-05 MED ORDER — ACETAMINOPHEN 325 MG PO TABS: 650.0000 mg | ORAL_TABLET | ORAL | Status: DC | PRN

## 2021-01-05 MED ORDER — OXYTOCIN-SODIUM CHLORIDE 30-0.9 UT/500ML-% IV SOLN
1.0000 m[IU]/min | INTRAVENOUS | Status: DC
  Administered 2021-01-05: 2 m[IU]/min via INTRAVENOUS
  Filled 2021-01-05: qty 500

## 2021-01-05 MED ORDER — OXYCODONE-ACETAMINOPHEN 5-325 MG PO TABS: 1.0000 | ORAL_TABLET | ORAL | Status: DC | PRN

## 2021-01-05 MED ORDER — LACTATED RINGERS IV SOLN: 500.0000 mL | INTRAVENOUS | Status: DC | PRN

## 2021-01-05 MED ORDER — SOD CITRATE-CITRIC ACID 500-334 MG/5ML PO SOLN: 30.0000 mL | ORAL | Status: DC | PRN

## 2021-01-05 MED ORDER — OXYTOCIN BOLUS FROM INFUSION
333.0000 mL | Freq: Once | INTRAVENOUS | Status: AC
  Administered 2021-01-06: 333 mL via INTRAVENOUS

## 2021-01-05 MED ORDER — OXYCODONE-ACETAMINOPHEN 5-325 MG PO TABS: 2.0000 | ORAL_TABLET | ORAL | Status: DC | PRN

## 2021-01-05 MED ORDER — LIDOCAINE HCL (PF) 1 % IJ SOLN: 30.0000 mL | INTRAMUSCULAR | Status: DC | PRN

## 2021-01-05 MED ORDER — OXYTOCIN-SODIUM CHLORIDE 30-0.9 UT/500ML-% IV SOLN: 2.5000 [IU]/h | INTRAVENOUS | Status: DC

## 2021-01-05 MED ORDER — FLEET ENEMA 7-19 GM/118ML RE ENEM: 1.0000 | ENEMA | RECTAL | Status: DC | PRN

## 2021-01-05 MED ORDER — LACTATED RINGERS IV SOLN: INTRAVENOUS | Status: DC

## 2021-01-05 MED ORDER — ONDANSETRON HCL 4 MG/2ML IJ SOLN: 4.0000 mg | Freq: Four times a day (QID) | INTRAMUSCULAR | Status: DC | PRN

## 2021-01-05 MED ORDER — TERBUTALINE SULFATE 1 MG/ML IJ SOLN: 0.2500 mg | Freq: Once | INTRAMUSCULAR | Status: DC | PRN

## 2021-01-05 MED ORDER — SODIUM CHLORIDE 0.9 % IV SOLN
2.0000 g | Freq: Once | INTRAVENOUS | Status: AC
  Administered 2021-01-05: 2 g via INTRAVENOUS
  Filled 2021-01-05: qty 2000

## 2021-01-05 NOTE — H&P (Signed)
Cheryl Maynard is a 20 y.o. female presenting for Contractions. While being evaluated for labor,  BPs noted to be elevated.  Labs sent.  Decision was made to proceed with Labor Induction/Augmentation.  Denies symptoms of headache, visual changes. . Prenatal care at [facility].   MAU Note: Cheryl Maynard  is a 19 y.o. y.o. year old G44P1001 female at [redacted]w[redacted]d weeks gestation who presents to MAU with contractions and elevated blood pressures. Hx HTN at [facility] visit today. Current blood pressure medication: None.  Associated symptoms: Neg for Headache, vision changes, epigastric pain Contractions: Q 7 minutes, moderate Vaginal bleeding: denies Fetal movement: Nml BPs:  132/90, 128/91, 133/86, 139/85  OB History     Gravida  2   Para  1   Term  1   Preterm      AB      Living  1      SAB      IAB      Ectopic      Multiple  0   Live Births  1          Past Medical History:  Diagnosis Date   Medical history non-contributory    Past Surgical History:  Procedure Laterality Date   WISDOM TOOTH EXTRACTION     Family History: family history includes Anemia in her mother. Social History:  reports that she has quit smoking. Her smoking use included cigarettes. She has never used smokeless tobacco. She reports that she does not drink alcohol and does not use drugs.     Maternal Diabetes: No Genetic Screening: Abnormal:  Results: Elevated AFP Maternal Ultrasounds/Referrals: Normal Fetal Ultrasounds or other Referrals:  None Maternal Substance Abuse:  No Significant Maternal Medications:  None Significant Maternal Lab Results:  Group B Strep positive Other Comments:  None  Review of Systems  Constitutional:  Negative for chills and fever.  Eyes:  Negative for visual disturbance.  Respiratory:  Negative for shortness of breath.   Gastrointestinal:  Negative for constipation, diarrhea, nausea and vomiting.  Genitourinary:   Positive for pelvic pain (with contractions). Negative for vaginal bleeding.  Neurological:  Negative for weakness.  Maternal Medical History:  Reason for admission: Contractions.  Nausea.  Contractions: Onset was 3-5 hours ago.   Frequency: irregular.   Perceived severity is moderate.   Fetal activity: Perceived fetal activity is normal.   Last perceived fetal movement was within the past hour.   Prenatal complications: PIH.   No bleeding, oligohydramnios, placental abnormality, polyhydramnios, pre-eclampsia or preterm labor.   Prenatal Complications - Diabetes: none.  Dilation: 3 Effacement (%): 50 Station: -2 Exam by:: [employer] Blood pressure 132/86, pulse 91, temperature 97.8 F (36.6 C), temperature source Oral, resp. rate 14, height 5' (1.524 m), weight 73.9 kg, last menstrual period 04/01/2020, SpO2 98 %. Maternal Exam:  Uterine Assessment: Contraction strength is moderate.  Contraction frequency is irregular.  Abdomen: Patient reports no abdominal tenderness. Estimated fetal weight is 7.   Fetal presentation: vertex Introitus: Normal vulva. Normal vagina.  Ferning test: not done.  Nitrazine test: not done. Amniotic fluid character: not assessed. Pelvis: adequate for delivery.   Cervix: Cervix evaluated by digital exam.     Fetal Exam Fetal Monitor Review: Mode: ultrasound.   Baseline rate: 140.  Variability: moderate (6-25 bpm).   Pattern: accelerations present and no decelerations.   Fetal State Assessment: Category I - tracings are normal.  Physical Exam Constitutional:      General:  She is not in acute distress.    Appearance: She is not ill-appearing or toxic-appearing.  HENT:     Head: Normocephalic.  Cardiovascular:     Rate and Rhythm: Normal rate.  Pulmonary:     Effort: Pulmonary effort is normal.  Abdominal:     Tenderness: There is no guarding or rebound.  Genitourinary:    General: Normal vulva.     Comments: Cervix  3cm/50% Musculoskeletal:        General: Normal range of motion.     Cervical back: Normal range of motion.  Skin:    General: Skin is warm and dry.  Neurological:     General: No focal deficit present.     Mental Status: She is alert and oriented to person, place, and time.     Deep Tendon Reflexes: Reflexes normal.  Psychiatric:        Mood and Affect: Mood normal.    Prenatal labs: ABO, Rh: --/--/O POS (06/21 1833) Antibody: NEG (06/21 1833) Rubella:   RPR:    HBsAg:    HIV:    GBS:     Assessment/Plan: Single IUP at [redacted]w[redacted]d Prodromal uterine contractions Gestational Hypertension No signs of preeclampsia GBS Positive  Admit to Labor and Delivery Routine Orders Ampicillin for GBS prophylaxis Pitocin, start 2 hours after Ampicillin infused in case of rapid labor Anticipate SVD   Wynelle Bourgeois 01/05/2021, 9:07 PM

## 2021-01-05 NOTE — MAU Note (Signed)
Cheryl Maynard is a 20 y.o. at [redacted]w[redacted]d here in MAU reporting: contractions started yesterday. They are every 8-10 minutes. No bleeding or LOF. +FM.  Onset of complaint: yesterday  Pain score: 3/10  Vitals:   01/05/21 1807  BP: 139/85  Pulse: 89  Resp: 16  Temp: 98.5 F (36.9 C)  SpO2: 98%     FHT:155  Lab orders placed from triage: none

## 2021-01-05 NOTE — Progress Notes (Signed)
Patient ID: Cheryl Maynard, female   DOB: 30-Mar-2001, 19 y.o.   MRN: [id number] Doing well  Vitals:   01/05/21 2002 01/05/21 2012 01/05/21 2150 01/05/21 2306  BP:  132/86 137/75 134/86  Pulse:  91 86 75  Resp:  14 16 14   Temp:  97.8 F (36.6 C)    TempSrc:  Oral    SpO2:      Weight: 73.9 kg     Height: 5' (1.524 m)      Pitocin started at 2308  FHR reactive UCs irregular  Dilation: 3.5 Effacement (%): 80 Cervical Position: Posterior Station: -1 Presentation: Vertex Exam by:: [email or web address] [place] RN  Anticipate SVD

## 2021-01-05 NOTE — MAU Provider Note (Signed)
Chief Complaint  Patient presents with   Contractions     Event Date/Time   First Provider Initiated Contact with Patient 01/05/21 1901       S: Cheryl Maynard  is a 20 y.o. y.o. year old G83P1001 female at [redacted]w[redacted]d weeks gestation who presents to MAU with contractions and elevated blood pressures. Hx HTN at [facility] visit today. Current blood pressure medication: None.  Associated symptoms: Neg for Headache, vision changes, epigastric pain Contractions: Q 7 minutes, moderate Vaginal bleeding: denies Fetal movement: Nml  Patient Active Problem List   Diagnosis Date Noted   Encounter for supervision of normal first pregnancy in third trimester 01/26/2018   Chlamydia infection affecting pregnancy in first trimester 01/26/2018  Elevated AFP   O: Patient Vitals for the past 24 hrs:  BP Temp Temp src Pulse Resp SpO2 Height Weight  01/05/21 1915 127/86 -- -- (!) 103 -- 98 % -- --  01/05/21 1900 129/86 -- -- (!) 102 -- 98 % -- --  01/05/21 1846 132/90 -- -- (!) 104 -- -- -- --  01/05/21 1831 (!) 128/91 -- -- 98 -- -- -- --  01/05/21 1829 133/86 -- -- 100 -- -- -- --  01/05/21 1807 139/85 98.5 F (36.9 C) Oral 89 16 98 % -- --  01/05/21 1804 -- -- -- -- -- -- 5' (1.524 m) 74.2 kg   General: NAD Heart: Regular rate Lungs: Normal rate and effort Abd: Soft, NT, Gravid, S=D Extremities: 2+ Pedal edema Neuro: 2+ deep tendon reflexes, 1 beat clonus Pelvic: Per RN Dilation: 3 Effacement (%): 50 Cervical Position: Posterior Station: -2 Presentation: Vertex Exam by:: Holly Flippin RN  EFM: 140, Moderate variability, 15 x 15 accelerations, no decelerations Toco: Q 5-8 , moderate  Results for orders placed or performed during the hospital encounter of 01/05/21 (from the past 24 hour(s))  CBC     Status: Abnormal   Collection Time: 01/05/21  6:32 PM  Result Value Ref Range   WBC 12.1 (H) 4.0 - 10.5 K/uL   RBC 4.51 3.87 - 5.11 MIL/uL   Hemoglobin 13.3 12.0 - 15.0 g/dL   HCT  16.0 73.7 - 10.6 %   MCV 85.8 80.0 - 100.0 fL   MCH 29.5 26.0 - 34.0 pg   MCHC 34.4 30.0 - 36.0 g/dL   RDW 26.9 48.5 - 46.2 %   Platelets 311 150 - 400 K/uL   nRBC 0.0 0.0 - 0.2 %  Comprehensive metabolic panel     Status: Abnormal   Collection Time: 01/05/21  6:32 PM  Result Value Ref Range   Sodium 136 135 - 145 mmol/L   Potassium 4.0 3.5 - 5.1 mmol/L   Chloride 105 98 - 111 mmol/L   CO2 23 22 - 32 mmol/L   Glucose, Bld 81 70 - 99 mg/dL   BUN 9 6 - 20 mg/dL   Creatinine, Ser 7.03 0.44 - 1.00 mg/dL   Calcium 9.5 8.9 - 50.0 mg/dL   Total Protein 6.4 (L) 6.5 - 8.1 g/dL   Albumin 2.9 (L) 3.5 - 5.0 g/dL   AST 24 15 - 41 U/L   ALT 20 0 - 44 U/L   Alkaline Phosphatase 199 (H) 38 - 126 U/L   Total Bilirubin 0.6 0.3 - 1.2 mg/dL   GFR, Estimated >93 >81 mL/min   Anion gap 8 5 - 15    MAU Course Orders Placed This Encounter  Procedures   Urinalysis, Routine w reflex microscopic Urine,  Clean Catch    Standing Status:   Standing    Number of Occurrences:   1   CBC    Standing Status:   Standing    Number of Occurrences:   1    Order Specific Question:   Specimen collection method    Answer:   Unit=Unit collect   Comprehensive metabolic panel    Standing Status:   Standing    Number of Occurrences:   1    Order Specific Question:   Specimen collection method    Answer:   Unit=Unit collect   Protein / creatinine ratio, urine    Standing Status:   Standing    Number of Occurrences:   1   Vital signs    Standing Status:   Standing    Number of Occurrences:   1    MDM - Gestational Hypertension at minimum. Still awaiting P:C ratio.   - Early labor.   A: [redacted]w[redacted]d week IUP Gestational hypertension vs preeclampsia FHR reactive  P: Admit to L&D per consult with Ervin. Routine L&D orders P:C ratio pending. No evidence of Pre-E w/ severe features.   Katrinka Blazing, [place], [place] 01/05/2021 7:09 PM

## 2021-01-06 ENCOUNTER — Inpatient Hospital Stay (HOSPITAL_COMMUNITY): Payer: Medicaid Other | Admitting: Anesthesiology

## 2021-01-06 ENCOUNTER — Encounter (HOSPITAL_COMMUNITY): Payer: Self-pay | Admitting: Obstetrics and Gynecology

## 2021-01-06 DIAGNOSIS — O139 Gestational [pregnancy-induced] hypertension without significant proteinuria, unspecified trimester: Secondary | ICD-10-CM | POA: Diagnosis not present

## 2021-01-06 DIAGNOSIS — Z3A40 40 weeks gestation of pregnancy: Secondary | ICD-10-CM

## 2021-01-06 LAB — CBC
HCT: 38.2 % (ref 36.0–46.0)
Hemoglobin: 12.6 g/dL (ref 12.0–15.0)
MCH: 28.7 pg (ref 26.0–34.0)
MCHC: 33 g/dL (ref 30.0–36.0)
MCV: 87 fL (ref 80.0–100.0)
Platelets: 291 10*3/uL (ref 150–400)
RBC: 4.39 MIL/uL (ref 3.87–5.11)
RDW: 14.1 % (ref 11.5–15.5)
WBC: 13 10*3/uL — ABNORMAL HIGH (ref 4.0–10.5)
nRBC: 0 % (ref 0.0–0.2)

## 2021-01-06 LAB — RPR: RPR Ser Ql: NONREACTIVE

## 2021-01-06 MED ORDER — SODIUM CHLORIDE 0.9 % IV SOLN
1.0000 g | Freq: Four times a day (QID) | INTRAVENOUS | Status: DC
  Administered 2021-01-06: 1 g via INTRAVENOUS
  Filled 2021-01-06 (×3): qty 1000

## 2021-01-06 MED ORDER — EPHEDRINE 5 MG/ML INJ: 10.0000 mg | INTRAVENOUS | Status: DC | PRN

## 2021-01-06 MED ORDER — ONDANSETRON HCL 4 MG/2ML IJ SOLN: 4.0000 mg | INTRAMUSCULAR | Status: DC | PRN

## 2021-01-06 MED ORDER — IBUPROFEN 600 MG PO TABS
600.0000 mg | ORAL_TABLET | Freq: Four times a day (QID) | ORAL | Status: DC
  Administered 2021-01-06 – 2021-01-07 (×5): 600 mg via ORAL
  Filled 2021-01-06 (×6): qty 1

## 2021-01-06 MED ORDER — DIBUCAINE (PERIANAL) 1 % EX OINT: 1.0000 "application " | TOPICAL_OINTMENT | CUTANEOUS | Status: DC | PRN

## 2021-01-06 MED ORDER — TETANUS-DIPHTH-ACELL PERTUSSIS 5-2.5-18.5 LF-MCG/0.5 IM SUSY: 0.5000 mL | PREFILLED_SYRINGE | Freq: Once | INTRAMUSCULAR | Status: DC

## 2021-01-06 MED ORDER — PHENYLEPHRINE 40 MCG/ML (10ML) SYRINGE FOR IV PUSH (FOR BLOOD PRESSURE SUPPORT): 80.0000 ug | PREFILLED_SYRINGE | INTRAVENOUS | Status: DC | PRN

## 2021-01-06 MED ORDER — ZOLPIDEM TARTRATE 5 MG PO TABS: 5.0000 mg | ORAL_TABLET | Freq: Every evening | ORAL | Status: DC | PRN

## 2021-01-06 MED ORDER — COCONUT OIL OIL: 1.0000 "application " | TOPICAL_OIL | Status: DC | PRN

## 2021-01-06 MED ORDER — LIDOCAINE HCL (PF) 1 % IJ SOLN
INTRAMUSCULAR | Status: DC | PRN
  Administered 2021-01-06 (×2): 4 mL via EPIDURAL

## 2021-01-06 MED ORDER — DIPHENHYDRAMINE HCL 25 MG PO CAPS: 25.0000 mg | ORAL_CAPSULE | Freq: Four times a day (QID) | ORAL | Status: DC | PRN

## 2021-01-06 MED ORDER — FENTANYL-BUPIVACAINE-NACL 0.5-0.125-0.9 MG/250ML-% EP SOLN
12.0000 mL/h | EPIDURAL | Status: DC | PRN
  Administered 2021-01-06: 11 mL/h via EPIDURAL
  Filled 2021-01-06: qty 250

## 2021-01-06 MED ORDER — SIMETHICONE 80 MG PO CHEW: 80.0000 mg | CHEWABLE_TABLET | ORAL | Status: DC | PRN

## 2021-01-06 MED ORDER — CARBOPROST TROMETHAMINE 250 MCG/ML IM SOLN
250.0000 ug | Freq: Once | INTRAMUSCULAR | Status: AC
  Administered 2021-01-06: 250 ug via INTRAMUSCULAR
  Filled 2021-01-06: qty 1

## 2021-01-06 MED ORDER — WITCH HAZEL-GLYCERIN EX PADS: 1.0000 "application " | MEDICATED_PAD | CUTANEOUS | Status: DC | PRN

## 2021-01-06 MED ORDER — DIPHENHYDRAMINE HCL 50 MG/ML IJ SOLN: 12.5000 mg | INTRAMUSCULAR | Status: DC | PRN

## 2021-01-06 MED ORDER — PRENATAL MULTIVITAMIN CH
1.0000 | ORAL_TABLET | Freq: Every day | ORAL | Status: DC
  Administered 2021-01-07: 1 via ORAL
  Filled 2021-01-06: qty 1

## 2021-01-06 MED ORDER — ONDANSETRON HCL 4 MG PO TABS: 4.0000 mg | ORAL_TABLET | ORAL | Status: DC | PRN

## 2021-01-06 MED ORDER — LACTATED RINGERS IV SOLN: 500.0000 mL | Freq: Once | INTRAVENOUS | Status: DC

## 2021-01-06 MED ORDER — BENZOCAINE-MENTHOL 20-0.5 % EX AERO: 1.0000 "application " | INHALATION_SPRAY | CUTANEOUS | Status: DC | PRN

## 2021-01-06 MED ORDER — DIPHENOXYLATE-ATROPINE 2.5-0.025 MG PO TABS
2.0000 | ORAL_TABLET | Freq: Once | ORAL | Status: AC
  Administered 2021-01-06: 2 via ORAL
  Filled 2021-01-06: qty 2

## 2021-01-06 MED ORDER — SENNOSIDES-DOCUSATE SODIUM 8.6-50 MG PO TABS
2.0000 | ORAL_TABLET | ORAL | Status: DC
  Administered 2021-01-07: 2 via ORAL
  Filled 2021-01-06 (×2): qty 2

## 2021-01-06 MED ORDER — ACETAMINOPHEN 325 MG PO TABS
650.0000 mg | ORAL_TABLET | ORAL | Status: DC | PRN
  Administered 2021-01-06: 650 mg via ORAL
  Filled 2021-01-06: qty 2

## 2021-01-06 MED ORDER — FENTANYL CITRATE (PF) 100 MCG/2ML IJ SOLN
100.0000 ug | INTRAMUSCULAR | Status: DC | PRN
  Administered 2021-01-06: 100 ug via INTRAVENOUS
  Filled 2021-01-06: qty 2

## 2021-01-06 MED ORDER — OXYCODONE HCL 5 MG PO TABS
5.0000 mg | ORAL_TABLET | Freq: Once | ORAL | Status: DC
  Filled 2021-01-06: qty 1

## 2021-01-06 NOTE — Anesthesia Procedure Notes (Signed)
Epidural Patient location during procedure: OB Start time: 01/06/2021 3:15 AM End time: 01/06/2021 3:23 AM  Staffing Anesthesiologist: Mal Amabile, MD Performed: anesthesiologist   Preanesthetic Checklist Completed: patient identified, IV checked, site marked, risks and benefits discussed, surgical consent, monitors and equipment checked, pre-op evaluation and timeout performed  Epidural Patient position: sitting Prep: DuraPrep and site prepped and draped Patient monitoring: continuous pulse ox and blood pressure Approach: midline Location: L3-L4 Injection technique: LOR air  Needle:  Needle type: Tuohy  Needle gauge: 17 G Needle length: 9 cm and 9 Needle insertion depth: 6 cm Catheter type: closed end flexible Catheter size: 19 Gauge Catheter at skin depth: 11 cm Test dose: negative and Other  Assessment Events: blood not aspirated, injection not painful, no injection resistance, no paresthesia and negative IV test  Additional Notes Patient identified. Risks and benefits discussed including failed block, incomplete  Pain control, post dural puncture headache, nerve damage, paralysis, blood pressure Changes, nausea, vomiting, reactions to medications-both toxic and allergic and post Partum back pain. All questions were answered. Patient expressed understanding and wished to proceed. Sterile technique was used throughout procedure. Epidural site was Dressed with sterile barrier dressing. No paresthesias, signs of intravascular injection Or signs of intrathecal spread were encountered.  Patient was more comfortable after the epidural was dosed. Please see RN's note for documentation of vital signs and FHR which are stable. Reason for block:procedure for pain

## 2021-01-06 NOTE — Progress Notes (Signed)
Patient ID: Cheryl Maynard, female   DOB: 09/11/2000, 20 y.o.   MRN: [id number] Doing well States has been resting, now starting to hurt more  Vitals:   01/05/21 2012 01/05/21 2150 01/05/21 2306 01/06/21 0128  BP: 132/86 137/75 134/86 (!) 138/93  Pulse: 91 86 75 95  Resp: 14 16 14 15   Temp: 97.8 F (36.6 C)     TempSrc: Oral     SpO2:      Weight:      Height:       FHR reactive, stable UCs more frequent, q 1-66min  Cervical exam deferred  Will continue to observe

## 2021-01-06 NOTE — Anesthesia Postprocedure Evaluation (Signed)
Anesthesia Post Note  Patient: Cheryl Maynard  Procedure(s) Performed: AN AD HOC LABOR EPIDURAL     Patient location during evaluation: Mother Baby Anesthesia Type: Epidural Level of consciousness: awake, oriented and awake and alert Pain management: pain level controlled Vital Signs Assessment: post-procedure vital signs reviewed and stable Respiratory status: spontaneous breathing, respiratory function stable and nonlabored ventilation Cardiovascular status: stable Postop Assessment: no headache, adequate PO intake, able to ambulate, patient able to bend at knees and no apparent nausea or vomiting Anesthetic complications: no   No notable events documented.  Last Vitals:  Vitals:   01/06/21 0632 01/06/21 1011  BP: 123/77 122/67  Pulse: (!) 108 90  Resp: 18 18  Temp: 36.8 C 36.5 C  SpO2: 98%     Last Pain:  Vitals:   01/06/21 1011  TempSrc: Oral  PainSc:    Pain Goal:                   Cheryl Maynard

## 2021-01-06 NOTE — Discharge Summary (Signed)
Postpartum Discharge Summary  Date of Service updated     Patient Name: Cheryl Maynard DOB: [birth year] MRN: [id number]  Date of admission: 01/05/2021 Delivery date:01/06/2021  Delivering provider: Seabron Spates  Date of discharge: 01/07/2021  Admitting diagnosis: Pregnancy [Z34.90] Intrauterine pregnancy: [redacted]w[redacted]d    Secondary diagnosis:  Active Problems:   Pregnancy   Vacuum-assisted vaginal delivery   Gestational hypertension  Additional problems: Gestational Hypertension                                       Terminal Bradycardia     Discharge diagnosis: Term Pregnancy Delivered and Gestational Hypertension                                              Post partum procedures: none Augmentation: AROM and Pitocin Complications: None  Hospital course: Induction of Labor With Vaginal Delivery   20y.o. yo G2P1001 at 469w0das admitted to the hospital 01/05/2021 for induction of labor.  Indication for induction: Gestational hypertension.  Patient had an uncomplicated labor course as follows: We gave her the first dose of Ampicillin before starting the Pitocin Contracted well with Pitocin infusion.and progressed steadily through labor Developed bradycardia with pushing and after 8 min, decision was made to proceed with vacuum assisted vacuum delivery, accomplished with 2 pulls and no pop-offs. Membrane Rupture Time/Date: 3:38 AM ,01/06/2021   Delivery Method:Vaginal, Vacuum (Extractor)  Episiotomy: None  Lacerations:  None  Details of delivery can be found in separate delivery note.  Patient had a routine postpartum course. Patient is discharged home 01/07/21.  Newborn Data: Birth date:01/06/2021  Birth time:3:48 AM  Gender:Female  Living status:Living  Apgars:9 ,9  Weight:3396 g   Magnesium Sulfate received: No BMZ received: No Rhophylac:N/A MMR:N/A T-DaP:Given prenatally Flu: N/A Transfusion:No  Physical exam  Vitals:   01/06/21 1011 01/06/21 1731 01/06/21  1958 01/07/21 0619  BP: 122/67 122/77 118/76 (!) 99/47  Pulse: 90 76 84 71  Resp: _0 Temp: 97.7 F (36.5 C) 97.9 F (36.6 C) 97.9 F (36.6 C) 97.7 F (36.5 C)  TempSrc: Oral Oral Oral Oral  SpO2:   100% 99%  Weight:      Height:       General: alert, cooperative, and no distress Lochia: appropriate Uterine Fundus: firm Incision: N/A DVT Evaluation: No evidence of DVT seen on physical exam. Negative Homan's sign. No cords or calf tenderness. No significant calf/ankle edema. Labs: Lab Results  Component Value Date   WBC 13.0 (H) 01/06/2021   HGB 12.6 01/06/2021   HCT 38.2 01/06/2021   MCV 87.0 01/06/2021   PLT 291 01/06/2021   CMP Latest Ref Rng & Units 01/05/2021  Glucose 70 - 99 mg/dL 81  BUN 6 - 20 mg/dL 9  Creatinine 0.44 - 1.00 mg/dL 0.47  Sodium 135 - 145 mmol/L 136  Potassium 3.5 - 5.1 mmol/L 4.0  Chloride 98 - 111 mmol/L 105  CO2 22 - 32 mmol/L 23  Calcium 8.9 - 10.3 mg/dL 9.5  Total Protein 6.5 - 8.1 g/dL 6.4(L)  Total Bilirubin 0.3 - 1.2 mg/dL 0.6  Alkaline Phos 38 - 126 U/L 199(H)  AST 15 - 41 U/L 24  ALT 0 - 44 U/L 20  Edinburgh Score: Edinburgh Postnatal Depression Scale Screening Tool 01/06/2021  I have been able to laugh and see the funny side of things. 0  I have looked forward with enjoyment to things. 0  I have blamed myself unnecessarily when things went wrong. 0  I have been anxious or worried for no good reason. 0  I have felt scared or panicky for no good reason. 0  Things have been getting on top of me. 0  I have been so unhappy that I have had difficulty sleeping. 0  I have felt sad or miserable. 0  I have been so unhappy that I have been crying. 0  The thought of harming myself has occurred to me. 0  Edinburgh Postnatal Depression Scale Total 0      After visit meds:  Allergies as of 01/07/2021   No Known Allergies      Medication List     TAKE these medications    acetaminophen 325 MG tablet Commonly known as:  Tylenol Take 2 tablets (650 mg total) by mouth every 6 (six) hours as needed (for pain scale < 4).   ibuprofen 600 MG tablet Commonly known as: ADVIL Take 1 tablet (600 mg total) by mouth every 6 (six) hours.   multivitamin-prenatal 27-0.8 MG Tabs tablet Take 1 tablet by mouth daily at 12 noon.   senna-docusate 8.6-50 MG tablet Commonly known as: Senokot-S Take 2 tablets by mouth daily. Start taking on: January 08, 2021        Discharge home in stable condition Infant Feeding: Bottle and Breast Infant Disposition:home with mother Discharge instruction: per After Visit Summary and Postpartum booklet. Activity: Advance as tolerated. Pelvic rest for 6 weeks.  Diet: routine diet Anticipated Birth Control: IUD and Unsure Postpartum Appointment:4 weeks Additional Postpartum F/U: BP check 1 week, message sent to [facility] Future Appointments: Follow up Visit:  [place] for [place] at [facility]. Schedule an appointment as soon as possible for a visit in 1 week(s).   Specialty: Obstetrics and Gynecology Why: BP check Contact information: [place] [address] [phone]                01/07/2021 Clarisa Fling, NP

## 2021-01-06 NOTE — Progress Notes (Signed)
Upon assessment patient was slightly firm with massage with moderate bleeding; voided and reassessed to find bleeding to be improved but still only firm with massage.  Small trickle noted to still be present.  Bladder scan shows 25 ml residual urine in the bladder.  MD updated; orders received.

## 2021-01-06 NOTE — Progress Notes (Signed)
Patient called out complaining of severe cramping and sweats about 30 minutes after received hemabate.  Upon assessment, patient's fundus is very firm, bleeding improved.  VSS; called Md and received order for one time dose of oxycodone.  Will monitor patient closely.

## 2021-01-06 NOTE — Anesthesia Preprocedure Evaluation (Signed)
Anesthesia Evaluation  Patient identified by MRN, date of birth, ID band Patient awake    Reviewed: Allergy & Precautions, Patient's Chart, lab work & pertinent test results  Airway Mallampati: II  TM Distance: >3 FB Neck ROM: Full    Dental no notable dental hx. (+) Teeth Intact   Pulmonary neg pulmonary ROS, former smoker,    Pulmonary exam normal breath sounds clear to auscultation       Cardiovascular negative cardio ROS Normal cardiovascular exam Rhythm:Regular Rate:Normal     Neuro/Psych negative psych ROS   GI/Hepatic Neg liver ROS, GERD  ,  Endo/Other  Obesity  Renal/GU negative Renal ROS  negative genitourinary   Musculoskeletal negative musculoskeletal ROS (+)   Abdominal (+) + obese,   Peds  Hematology negative hematology ROS (+)   Anesthesia Other Findings   Reproductive/Obstetrics (+) Pregnancy                             Anesthesia Physical Anesthesia Plan  ASA: 2  Anesthesia Plan: Epidural   Post-op Pain Management:    Induction:   PONV Risk Score and Plan:   Airway Management Planned: Natural Airway  Additional Equipment:   Intra-op Plan:   Post-operative Plan:   Informed Consent: I have reviewed the patients History and Physical, chart, labs and discussed the procedure including the risks, benefits and alternatives for the proposed anesthesia with the patient or authorized representative who has indicated his/her understanding and acceptance.       Plan Discussed with: Anesthesiologist  Anesthesia Plan Comments:         Anesthesia Quick Evaluation

## 2021-01-07 ENCOUNTER — Inpatient Hospital Stay (HOSPITAL_COMMUNITY): Admit: 2021-01-07 | Payer: Self-pay

## 2021-01-07 DIAGNOSIS — Z3A39 39 weeks gestation of pregnancy: Secondary | ICD-10-CM

## 2021-01-07 DIAGNOSIS — O134 Gestational [pregnancy-induced] hypertension without significant proteinuria, complicating childbirth: Secondary | ICD-10-CM

## 2021-01-07 DIAGNOSIS — O99820 Streptococcus B carrier state complicating pregnancy: Secondary | ICD-10-CM

## 2021-01-07 MED ORDER — SENNOSIDES-DOCUSATE SODIUM 8.6-50 MG PO TABS: 2.0000 | ORAL_TABLET | ORAL | 0 refills | Status: AC

## 2021-01-07 MED ORDER — ACETAMINOPHEN 325 MG PO TABS: 650.0000 mg | ORAL_TABLET | Freq: Four times a day (QID) | ORAL | 0 refills | Status: AC | PRN

## 2021-01-07 MED ORDER — IBUPROFEN 600 MG PO TABS: 600.0000 mg | ORAL_TABLET | Freq: Four times a day (QID) | ORAL | 0 refills | Status: AC

## 2021-01-07 NOTE — Progress Notes (Signed)
POSTPARTUM PROGRESS NOTE  Post Partum Day 1  Subjective:  Cheryl Maynard is a 20 y.o. [id number] s/p vacuum assisted vaginal delivery at [redacted]w[redacted]d.  No acute events overnight.  Pt denies problems with ambulating, voiding or po intake.  She denies nausea or vomiting.  Pain is well controlled.  She has had flatus. She has had bowel movement.  Lochia Minimal.   Objective: Blood pressure (!) 99/47, pulse 71, temperature 97.7 F (36.5 C), temperature source Oral, resp. rate 16, height 5' (1.524 m), weight 73.9 kg, last menstrual period 04/01/2020, SpO2 99 %, unknown if currently breastfeeding.  Patient Vitals for the past 24 hrs:  BP Temp Temp src Pulse Resp SpO2  01/07/21 0619 (!) 99/47 97.7 F (36.5 C) Oral 71 16 99 %  01/06/21 1958 118/76 97.9 F (36.6 C) Oral 84 16 100 %  01/06/21 1731 122/77 97.9 F (36.6 C) Oral 76 16 --    Physical Exam:  General: alert, cooperative and no distress Chest: no respiratory distress Heart:regular rate, distal pulses intact Abdomen: soft, nontender,  Uterine Fundus: firm, appropriately tender DVT Evaluation: No calf swelling or tenderness Extremities: No edema Skin: warm, dry  Recent Labs    01/05/21 1832 01/06/21 0240  HGB 13.3 12.6  HCT 38.7 38.2    Assessment/Plan: Cheryl Maynard is a 20 y.o. [id number] s/p VAVD at [redacted]w[redacted]d   PPD#1 - Doing well Contraception: Paragard IUD? Feeding: both Dispo: Plan for discharge tomorrow.   LOS: 2 days   Braxton Vantrease, Odie Sera, NP, CNM 01/07/2021, 12:53 PM

## 2021-01-07 NOTE — Progress Notes (Signed)
Pt refuses lactation assistance at this time. She will call out if she changes her mind.

## 2021-01-15 ENCOUNTER — Other Ambulatory Visit: Payer: Self-pay

## 2021-01-15 ENCOUNTER — Ambulatory Visit (INDEPENDENT_AMBULATORY_CARE_PROVIDER_SITE_OTHER): Payer: Medicaid Other

## 2021-01-15 VITALS — BP 122/89 | HR 75 | Wt 145.3 lb

## 2021-01-15 DIAGNOSIS — Z0130 Encounter for examination of blood pressure without abnormal findings: Secondary | ICD-10-CM

## 2021-01-15 NOTE — Progress Notes (Signed)
Patient was assessed and managed by nursing staff during this encounter. I have reviewed the chart and agree with the documentation and plan.   Alisia Vanengen, MD 01/15/2021 10:48 AM 

## 2021-01-15 NOTE — Progress Notes (Signed)
Patient here for blood pressure check following spontaneous vaginal on 01/06/21. History significant for gestational hypertension; no medication for blood pressure. BP today is 122/89, HR 75. Patient endorses headache on two different days following discharge that improved with Tylenol and ibuprofen. Reviewed with Anyanwu, MD, who recommends pt return in 1 week for BP check.  Breastfeeding is going well; lactation services offered. PP depression screening negative; [facility] services offered.  Fleet Contras RN 01/15/21

## 2021-01-19 ENCOUNTER — Telehealth (HOSPITAL_COMMUNITY): Payer: Self-pay | Admitting: *Deleted

## 2021-01-19 NOTE — Telephone Encounter (Signed)
Mom reports feeling good. EPDS = 0. No concerns about herself. Mom reports baby is doing well. Breastfeeding with occasional formula when mom has an appt. Feeding, peeing, and pooping well. No concerns about baby. Duffy Rhody, RN on 01/19/2021 at 3:35p

## 2021-01-22 ENCOUNTER — Ambulatory Visit (INDEPENDENT_AMBULATORY_CARE_PROVIDER_SITE_OTHER): Payer: Medicaid Other

## 2021-01-22 ENCOUNTER — Other Ambulatory Visit: Payer: Self-pay

## 2021-01-22 VITALS — BP 109/79 | HR 62 | Wt 145.4 lb

## 2021-01-22 DIAGNOSIS — Z0130 Encounter for examination of blood pressure without abnormal findings: Secondary | ICD-10-CM

## 2021-01-22 NOTE — Progress Notes (Signed)
Patient here for blood pressure check following spontaneous vaginal delivery on 01/06/21. History of gestation hypertension, no medication. Pt seen for BP check on 01/15/21, BP at that visit was 122/89. BP today is 109/79, HR 62. Reports 2 headaches in past week that improved with Tylenol and ibuprofen. Patient denies any other symptoms of hypertension. Reviewed with Debroah Loop, MD. Pt to follow up at [facility] appt on 02/11/21  [facility] RN 01/22/21

## 2021-02-04 ENCOUNTER — Ambulatory Visit
Admission: EM | Admit: 2021-02-04 | Discharge: 2021-02-04 | Disposition: A | Discharge status: Home / Self Care | Payer: Medicaid Other | Attending: Physician Assistant | Admitting: Physician Assistant

## 2021-02-04 ENCOUNTER — Encounter: Payer: Self-pay | Admitting: Emergency Medicine

## 2021-02-04 ENCOUNTER — Other Ambulatory Visit: Payer: Self-pay

## 2021-02-04 DIAGNOSIS — N61 Mastitis without abscess: Secondary | ICD-10-CM

## 2021-02-04 DIAGNOSIS — N644 Mastodynia: Secondary | ICD-10-CM

## 2021-02-04 DIAGNOSIS — R519 Headache, unspecified: Secondary | ICD-10-CM

## 2021-02-04 DIAGNOSIS — R509 Fever, unspecified: Secondary | ICD-10-CM | POA: Diagnosis not present

## 2021-02-04 MED ORDER — ACETAMINOPHEN 325 MG PO TABS
650.0000 mg | ORAL_TABLET | Freq: Once | ORAL | Status: AC
  Administered 2021-02-04: 650 mg via ORAL

## 2021-02-04 MED ORDER — CEPHALEXIN 500 MG PO CAPS: 500.0000 mg | ORAL_CAPSULE | Freq: Four times a day (QID) | ORAL | 0 refills | Status: AC

## 2021-02-04 NOTE — ED Provider Notes (Signed)
[facility]    CSN: [id number] Arrival date & time: 02/04/21  1938      History   Chief Complaint Chief Complaint  Patient presents with   Diarrhea   Fever    HPI Cheryl Maynard is a 20 y.o. female.   Patient presents today with a 2-day history of diarrhea.  She reports that symptoms have worsened and today she developed a history of severe headache, fever, chills, breast tenderness, swelling, breast pain.  She recently gave birth on 01/06/2021 and was induced as a result of gestational hypertension.  She is currently breast-feeding.  She reports breast pain is rated 5 on a 0-10 pain scale, localized to lateral right breast without radiation, described as aching, worse with lactation or palpation, no alleviating factors identified.  She has tried Tylenol with temporary improvement of symptoms.  She denies any recent antibiotic use.  She denies history of MRSA.  She denies additional symptoms including abdominal pain, vomiting, dizziness, syncope, cough, congestion.   Past Medical History:  Diagnosis Date   Medical history non-contributory     Patient Active Problem List   Diagnosis Date Noted   Vacuum-assisted vaginal delivery 01/06/2021   Gestational hypertension 01/06/2021   Pregnancy 01/05/2021    Past Surgical History:  Procedure Laterality Date   WISDOM TOOTH EXTRACTION      OB History     Gravida  2   Para  2   Term  2   Preterm      AB      Living  2      SAB      IAB      Ectopic      Multiple  0   Live Births  2            Home Medications    Prior to Admission medications   Medication Sig Start Date End Date Taking? Authorizing Provider  cephALEXin (KEFLEX) 500 MG capsule Take 1 capsule (500 mg total) by mouth 4 (four) times daily for 7 days. 02/04/21 02/11/21 Yes Dawanna Grauberger, Noberto Retort, PA-C  acetaminophen (TYLENOL) 325 MG tablet Take 2 tablets (650 mg total) by mouth every 6 (six) hours as needed (for pain scale < 4).  01/07/21   Nugent, Odie Sera, NP  ibuprofen (ADVIL) 600 MG tablet Take 1 tablet (600 mg total) by mouth every 6 (six) hours. 01/07/21   Nugent, Odie Sera, NP  Prenatal Vit-Fe Fumarate-FA (MULTIVITAMIN-PRENATAL) 27-0.8 MG TABS tablet Take 1 tablet by mouth daily at 12 noon.    [provider]  senna-docusate (SENOKOT-S) 8.6-50 MG tablet Take 2 tablets by mouth daily. Patient not taking: Reported on 01/22/2021 01/08/21   Nugent, Odie Sera, NP    Family History Family History  Problem Relation Age of Onset   Anemia Mother    ADD / ADHD Neg Hx    Alcohol abuse Neg Hx    Anxiety disorder Neg Hx    Asthma Neg Hx    Arthritis Neg Hx    Birth defects Neg Hx    Cancer Neg Hx    COPD Neg Hx    Depression Neg Hx    Diabetes Neg Hx    Drug abuse Neg Hx    Hearing loss Neg Hx    Early death Neg Hx    Heart disease Neg Hx    Hyperlipidemia Neg Hx    Hypertension Neg Hx    Intellectual disability Neg Hx    Kidney disease  Neg Hx    Learning disabilities Neg Hx    Miscarriages / Stillbirths Neg Hx    Obesity Neg Hx    Stroke Neg Hx    Vision loss Neg Hx    Varicose Veins Neg Hx     Social History Social History   Tobacco Use   Smoking status: Former    Types: Cigarettes   Smokeless tobacco: Never   Tobacco comments:    quit with +preg test  Vaping Use   Vaping Use: Former   Substances: Nicotine  Substance Use Topics   Alcohol use: No   Drug use: No     Allergies   Patient has no known allergies.   Review of Systems Review of Systems  Constitutional:  Positive for activity change, fatigue and fever. Negative for appetite change.  HENT:  Negative for congestion, sinus pressure, sneezing and sore throat.   Respiratory:  Negative for cough and shortness of breath.   Cardiovascular:  Negative for chest pain.  Gastrointestinal:  Positive for diarrhea and nausea. Negative for abdominal pain and vomiting.  Musculoskeletal:  Negative for arthralgias and myalgias.  Skin:   Positive for color change.  Neurological:  Positive for headaches. Negative for dizziness and light-headedness.    Physical Exam Triage Vital Signs ED Triage Vitals [02/04/21 2027]  Enc Vitals Group     BP 114/75     Pulse Rate (!) 125     Resp 18     Temp (!) 102.9 F (39.4 C)     Temp Source Oral     SpO2 95 %     Weight      Height      Head Circumference      Peak Flow      Pain Score 5     Pain Loc      Pain Edu?      Excl. in GC?    No data found.  Updated Vital Signs BP 110/73 (BP Location: Left Arm)   Pulse (!) 120   Temp (!) 100.9 F (38.3 C) (Temporal) Comment: Would have taken oral but patient just drank ice water  Resp 18   LMP 04/01/2020   SpO2 96%   Breastfeeding Yes   Visual Acuity Right Eye Distance:   Left Eye Distance:   Bilateral Distance:    Right Eye Near:   Left Eye Near:    Bilateral Near:     Physical Exam Vitals reviewed.  Constitutional:      General: She is awake. She is not in acute distress.    Appearance: Normal appearance. She is normal weight. She is not ill-appearing.     Comments: Very pleasant female appears stated age in no acute distress sitting comfortably in exam room  HENT:     Head: Normocephalic and atraumatic.     Mouth/Throat:     Pharynx: Uvula midline. No oropharyngeal exudate or posterior oropharyngeal erythema.  Cardiovascular:     Rate and Rhythm: Normal rate and regular rhythm.     Heart sounds: Normal heart sounds, S1 normal and S2 normal. No murmur heard. Pulmonary:     Effort: Pulmonary effort is normal.     Breath sounds: Normal breath sounds. No wheezing, rhonchi or rales.     Comments: Clear to auscultation bilaterally Chest:  Breasts:    Right: Swelling and tenderness present. No mass, nipple discharge or skin change.     Comments: Right breast: Tenderness and erythema noted lateral right breast.  No nipple discharge.  No palpable mass. Abdominal:     General: Bowel sounds are normal.      Palpations: Abdomen is soft.     Tenderness: There is no abdominal tenderness. There is no right CVA tenderness, left CVA tenderness, guarding or rebound.  Psychiatric:        Behavior: Behavior is cooperative.     UC Treatments / Results  Labs (all labs ordered are listed, but only abnormal results are displayed) Labs Reviewed - No data to display  EKG   Radiology No results found.  Procedures Procedures (including critical care time)  Medications Ordered in UC Medications  acetaminophen (TYLENOL) tablet 650 mg (650 mg Oral Given 02/04/21 2042)    Initial Impression / Assessment and Plan / UC Course  I have reviewed the triage vital signs and the nursing notes.  Pertinent labs & imaging results that were available during my care of the patient were reviewed by me and considered in my medical decision making (see chart for details).      Concern for mastitis given clinical presentation.  Patient was given Tylenol in clinic with improvement of fever.  She was started on Keflex 4 times daily for 7 days she does not have any risk factors for MRSA infection.  Recommend she use warm compresses.  She can continue using Tylenol for pain and fever.  Discussed that if symptoms not significantly improve or if anything worsens she needs to go to the emergency room.  She is to follow-up with OB/GYN first thing next week.  Strict return precautions given to which patient expressed understanding.  Final Clinical Impressions(s) / UC Diagnoses   Final diagnoses:  Mastitis  Fever, unspecified  Breast pain  Nonintractable headache, unspecified chronicity pattern, unspecified headache type     Discharge Instructions      Start Keflex 4 times a day.  You should have significant improvement of symptoms within 24 hours and if you do not have improvement of symptoms or if redness/warmth continues to spread after being on the antibiotics you need to be seen immediately.  Use Tylenol for fever  and pain.  If anything worsens please go directly to the emergency room as we discussed.     ED Prescriptions     Medication Sig Dispense Auth. Provider   cephALEXin (KEFLEX) 500 MG capsule Take 1 capsule (500 mg total) by mouth 4 (four) times daily for 7 days. 28 capsule Marlyn Rabine K, PA-C      PDMP not reviewed this encounter.   Jeani Hawking, PA-C 02/04/21 2058

## 2021-02-04 NOTE — Discharge Instructions (Addendum)
Start Keflex 4 times a day.  You should have significant improvement of symptoms within 24 hours and if you do not have improvement of symptoms or if redness/warmth continues to spread after being on the antibiotics you need to be seen immediately.  Use Tylenol for fever and pain.  If anything worsens please go directly to the emergency room as we discussed.

## 2021-02-04 NOTE — ED Triage Notes (Signed)
Diarrhea starting two days ago with a migraine (no hx of previous), photosensitivity. Right sided breast redness, swelling, pain, tenderness, weakness starting yesterday, chills starting today.  Was induced d/t high BP on 01/06/21

## 2021-02-05 ENCOUNTER — Emergency Department (HOSPITAL_COMMUNITY)
Admission: EM | Admit: 2021-02-05 | Discharge: 2021-02-06 | Disposition: A | Discharge status: Home / Self Care | Payer: Medicaid Other | Attending: Emergency Medicine | Admitting: Emergency Medicine

## 2021-02-05 ENCOUNTER — Encounter (HOSPITAL_COMMUNITY): Payer: Self-pay

## 2021-02-05 DIAGNOSIS — R509 Fever, unspecified: Secondary | ICD-10-CM | POA: Diagnosis present

## 2021-02-05 DIAGNOSIS — E876 Hypokalemia: Secondary | ICD-10-CM | POA: Insufficient documentation

## 2021-02-05 DIAGNOSIS — N61 Mastitis without abscess: Secondary | ICD-10-CM | POA: Insufficient documentation

## 2021-02-05 DIAGNOSIS — Z87891 Personal history of nicotine dependence: Secondary | ICD-10-CM | POA: Insufficient documentation

## 2021-02-05 LAB — CBC WITH DIFFERENTIAL/PLATELET
Abs Immature Granulocytes: 0.05 10*3/uL (ref 0.00–0.07)
Basophils Absolute: 0 10*3/uL (ref 0.0–0.1)
Basophils Relative: 0 %
Eosinophils Absolute: 0 10*3/uL (ref 0.0–0.5)
Eosinophils Relative: 0 %
HCT: 33.7 % — ABNORMAL LOW (ref 36.0–46.0)
Hemoglobin: 11.4 g/dL — ABNORMAL LOW (ref 12.0–15.0)
Immature Granulocytes: 0 %
Lymphocytes Relative: 14 %
Lymphs Abs: 1.6 10*3/uL (ref 0.7–4.0)
MCH: 29 pg (ref 26.0–34.0)
MCHC: 33.8 g/dL (ref 30.0–36.0)
MCV: 85.8 fL (ref 80.0–100.0)
Monocytes Absolute: 0.6 10*3/uL (ref 0.1–1.0)
Monocytes Relative: 5 %
Neutro Abs: 9 10*3/uL — ABNORMAL HIGH (ref 1.7–7.7)
Neutrophils Relative %: 81 %
Platelets: 292 10*3/uL (ref 150–400)
RBC: 3.93 MIL/uL (ref 3.87–5.11)
RDW: 13.2 % (ref 11.5–15.5)
WBC: 11.3 10*3/uL — ABNORMAL HIGH (ref 4.0–10.5)
nRBC: 0 % (ref 0.0–0.2)

## 2021-02-05 LAB — BASIC METABOLIC PANEL
Anion gap: 9 (ref 5–15)
BUN: 9 mg/dL (ref 6–20)
CO2: 20 mmol/L — ABNORMAL LOW (ref 22–32)
Calcium: 8.3 mg/dL — ABNORMAL LOW (ref 8.9–10.3)
Chloride: 105 mmol/L (ref 98–111)
Creatinine, Ser: 0.59 mg/dL (ref 0.44–1.00)
GFR, Estimated: >60 mL/min (ref >60)
Glucose, Bld: 110 mg/dL — ABNORMAL HIGH (ref 70–99)
Potassium: 2.7 mmol/L — CL (ref 3.5–5.1)
Sodium: 134 mmol/L — ABNORMAL LOW (ref 135–145)

## 2021-02-05 MED ORDER — ACETAMINOPHEN 500 MG PO TABS
1000.0000 mg | ORAL_TABLET | Freq: Once | ORAL | Status: AC
  Administered 2021-02-06: 1000 mg via ORAL
  Filled 2021-02-05: qty 2

## 2021-02-05 MED ORDER — POTASSIUM CHLORIDE CRYS ER 20 MEQ PO TBCR: 40.0000 meq | EXTENDED_RELEASE_TABLET | Freq: Once | ORAL | Status: DC

## 2021-02-05 NOTE — ED Provider Notes (Signed)
Emergency Medicine Provider Triage Evaluation Note  Cheryl Maynard , a 20 y.o. female  was evaluated in triage.  Pt complains of mastitis.  Patient was seen at the urgent care yesterday, started Keflex last night without improvement.  She has pain to the right breast as well as fevers, max 102..  Still breast-feeding, no discharge  Review of Systems  Positive: Right breast pain, fever Negative: Nausea   Physical Exam  BP 112/70   Pulse (!) 108   Temp (!) 102 F (38.9 C)   Resp 20   Ht 5' (1.524 m)   Wt 61.2 kg   LMP 04/01/2020   SpO2 97%   BMI 26.37 kg/m  Gen:   Awake, no distress   Resp:  Normal effort  MSK:   Moves extremities without difficulty  Other:    Medical Decision Making  Medically screening exam initiated at 3:55 PM.  Appropriate orders placed.  Lisia Gonzalez-Islas was informed that the remainder of the evaluation will be completed by another provider, this initial triage assessment does not replace that evaluation, and the importance of remaining in the ED until their evaluation is complete.     Theron Arista, PA-C 02/05/21 1557    Margarita Grizzle, MD 02/13/21 [phone]

## 2021-02-05 NOTE — ED Triage Notes (Signed)
Pt arrives POV For eval of R sided breast pain onset Wednesday evening, seen at [facility] for eval and found to have mastitis on R side. Started on cephalexin yesterday by UC but reports continued high fevers Tmax 103.7 at home. Took 650mg  of tylenol at home this afternoon.

## 2021-02-06 ENCOUNTER — Encounter (HOSPITAL_COMMUNITY): Payer: Self-pay | Admitting: Emergency Medicine

## 2021-02-06 ENCOUNTER — Emergency Department (HOSPITAL_COMMUNITY): Payer: Medicaid Other

## 2021-02-06 LAB — URINALYSIS, ROUTINE W REFLEX MICROSCOPIC
Bacteria, UA: NONE SEEN
Bilirubin Urine: NEGATIVE
Glucose, UA: NEGATIVE mg/dL
Hgb urine dipstick: NEGATIVE
Ketones, ur: 5 mg/dL — AB
Leukocytes,Ua: NEGATIVE
Nitrite: NEGATIVE
Protein, ur: 30 mg/dL — AB
Specific Gravity, Urine: 1.014 (ref 1.005–1.030)
pH: 6 (ref 5.0–8.0)

## 2021-02-06 MED ORDER — IBUPROFEN 800 MG PO TABS
800.0000 mg | ORAL_TABLET | Freq: Once | ORAL | Status: AC
  Administered 2021-02-06: 800 mg via ORAL
  Filled 2021-02-06 (×2): qty 1

## 2021-02-06 MED ORDER — CEFTRIAXONE SODIUM 1 G IJ SOLR
1.0000 g | Freq: Once | INTRAMUSCULAR | Status: AC
  Administered 2021-02-06: 1 g via INTRAMUSCULAR
  Filled 2021-02-06: qty 10

## 2021-02-06 MED ORDER — POTASSIUM CHLORIDE CRYS ER 20 MEQ PO TBCR
80.0000 meq | EXTENDED_RELEASE_TABLET | Freq: Once | ORAL | Status: AC
  Administered 2021-02-06: 80 meq via ORAL
  Filled 2021-02-06: qty 4

## 2021-02-06 MED ORDER — POTASSIUM CHLORIDE CRYS ER 20 MEQ PO TBCR: 20.0000 meq | EXTENDED_RELEASE_TABLET | Freq: Two times a day (BID) | ORAL | 0 refills | Status: AC

## 2021-02-06 NOTE — ED Provider Notes (Signed)
[facility] EMERGENCY DEPARTMENT Provider Note   CSN: [id number] Arrival date & time: 02/05/21  1534     History Chief Complaint  Patient presents with   Fever    Cheryl Maynard is a 20 y.o. female.  The history is provided by the patient.  Fever Max temp prior to arrival:  102 Severity:  Moderate Onset quality:  Gradual Duration:  2 days Timing:  Intermittent Progression:  Waxing and waning Chronicity:  New Relieved by:  Nothing Worsened by:  Nothing Ineffective treatments: a few doses of keflex thazt was just started for R mastitis. Associated symptoms: no chest pain, no chills, no confusion, no congestion, no cough, no diarrhea, no dysuria, no ear pain, no headaches, no myalgias, no rhinorrhea, no somnolence, no sore throat and no vomiting   Risk factors: no contaminated food and no sick contacts   Seen at Urgent care and diagnosed with mastitis, has taken one days worth of medication but fever has not stopped as of now.  No other symptoms.      Past Medical History:  Diagnosis Date   Medical history non-contributory     Patient Active Problem List   Diagnosis Date Noted   Vacuum-assisted vaginal delivery 01/06/2021   Gestational hypertension 01/06/2021   Pregnancy 01/05/2021    Past Surgical History:  Procedure Laterality Date   WISDOM TOOTH EXTRACTION       OB History     Gravida  2   Para  2   Term  2   Preterm      AB      Living  2      SAB      IAB      Ectopic      Multiple  0   Live Births  2           Family History  Problem Relation Age of Onset   Anemia Mother    ADD / ADHD Neg Hx    Alcohol abuse Neg Hx    Anxiety disorder Neg Hx    Asthma Neg Hx    Arthritis Neg Hx    Birth defects Neg Hx    Cancer Neg Hx    COPD Neg Hx    Depression Neg Hx    Diabetes Neg Hx    Drug abuse Neg Hx    Hearing loss Neg Hx    Early death Neg Hx    Heart disease Neg Hx    Hyperlipidemia Neg Hx     Hypertension Neg Hx    Intellectual disability Neg Hx    Kidney disease Neg Hx    Learning disabilities Neg Hx    Miscarriages / Stillbirths Neg Hx    Obesity Neg Hx    Stroke Neg Hx    Vision loss Neg Hx    Varicose Veins Neg Hx     Social History   Tobacco Use   Smoking status: Former    Types: Cigarettes   Smokeless tobacco: Never   Tobacco comments:    quit with +preg test  Vaping Use   Vaping Use: Former   Substances: Nicotine  Substance Use Topics   Alcohol use: No   Drug use: No    Home Medications Prior to Admission medications   Medication Sig Start Date End Date Taking? Authorizing Provider  potassium chloride SA (KLOR-CON) 20 MEQ tablet Take 1 tablet (20 mEq total) by mouth 2 (two) times daily. 02/06/21  Yes Kester Stimpson,  Shemia Bevel, MD  acetaminophen (TYLENOL) 325 MG tablet Take 2 tablets (650 mg total) by mouth every 6 (six) hours as needed (for pain scale < 4). 01/07/21   Nugent, Odie Sera, NP  cephALEXin (KEFLEX) 500 MG capsule Take 1 capsule (500 mg total) by mouth 4 (four) times daily for 7 days. 02/04/21 02/11/21  Raspet, Noberto Retort, PA-C  ibuprofen (ADVIL) 600 MG tablet Take 1 tablet (600 mg total) by mouth every 6 (six) hours. 01/07/21   Nugent, Odie Sera, NP  Prenatal Vit-Fe Fumarate-FA (MULTIVITAMIN-PRENATAL) 27-0.8 MG TABS tablet Take 1 tablet by mouth daily at 12 noon.    [provider]  senna-docusate (SENOKOT-S) 8.6-50 MG tablet Take 2 tablets by mouth daily. Patient not taking: Reported on 01/22/2021 01/08/21   Nugent, Odie Sera, NP    Allergies    Patient has no known allergies.  Review of Systems   Review of Systems  Constitutional:  Positive for fever. Negative for chills.  HENT:  Negative for congestion, ear pain, rhinorrhea and sore throat.   Eyes:  Negative for redness.  Respiratory:  Negative for cough.   Cardiovascular:  Negative for chest pain.  Gastrointestinal:  Negative for diarrhea and vomiting.  Genitourinary:  Negative for dysuria.   Musculoskeletal:  Negative for myalgias.  Skin:  Negative for wound.  Neurological:  Negative for headaches.  Psychiatric/Behavioral:  Negative for confusion.   All other systems reviewed and are negative.  Physical Exam Updated Vital Signs BP 125/87   Pulse 95   Temp (!) 100.4 F (38 C)   Resp 16   Ht 5' (1.524 m)   Wt 61.2 kg   LMP 04/01/2020   SpO2 100%   BMI 26.37 kg/m   Physical Exam Vitals and nursing note reviewed. Exam conducted with a chaperone present Veterinary surgeon).  Constitutional:      General: She is not in acute distress.    Appearance: Normal appearance. She is not ill-appearing, toxic-appearing or diaphoretic.  HENT:     Head: Normocephalic and atraumatic.     Nose: Nose normal.  Eyes:     Conjunctiva/sclera: Conjunctivae normal.     Pupils: Pupils are equal, round, and reactive to light.  Cardiovascular:     Rate and Rhythm: Normal rate and regular rhythm.     Pulses: Normal pulses.     Heart sounds: Normal heart sounds.  Pulmonary:     Effort: Pulmonary effort is normal.     Breath sounds: Normal breath sounds.  Chest:    Abdominal:     General: Abdomen is flat. Bowel sounds are normal.     Palpations: Abdomen is soft.     Tenderness: There is no abdominal tenderness. There is no guarding.  Musculoskeletal:        General: Normal range of motion.     Cervical back: Normal range of motion and neck supple.  Skin:    General: Skin is warm and dry.     Capillary Refill: Capillary refill takes less than 2 seconds.  Neurological:     General: No focal deficit present.     Mental Status: She is alert and oriented to person, place, and time.     Deep Tendon Reflexes: Reflexes normal.  Psychiatric:        Mood and Affect: Mood normal.        Behavior: Behavior normal.    ED Results / Procedures / Treatments   Labs (all labs ordered are listed, but only abnormal results  are displayed) Results for orders placed or performed during the hospital  encounter of 02/05/21  Basic metabolic panel  Result Value Ref Range   Sodium 134 (L) 135 - 145 mmol/L   Potassium 2.7 (LL) 3.5 - 5.1 mmol/L   Chloride 105 98 - 111 mmol/L   CO2 20 (L) 22 - 32 mmol/L   Glucose, Bld 110 (H) 70 - 99 mg/dL   BUN 9 6 - 20 mg/dL   Creatinine, Ser 6.040.59 0.44 - 1.00 mg/dL   Calcium 8.3 (L) 8.9 - 10.3 mg/dL   GFR, Estimated >54>60 >09>60 mL/min   Anion gap 9 5 - 15  CBC with Differential  Result Value Ref Range   WBC 11.3 (H) 4.0 - 10.5 K/uL   RBC 3.93 3.87 - 5.11 MIL/uL   Hemoglobin 11.4 (L) 12.0 - 15.0 g/dL   HCT 81.133.7 (L) 91.436.0 - 78.246.0 %   MCV 85.8 80.0 - 100.0 fL   MCH 29.0 26.0 - 34.0 pg   MCHC 33.8 30.0 - 36.0 g/dL   RDW 95.613.2 21.311.5 - 08.615.5 %   Platelets 292 150 - 400 K/uL   nRBC 0.0 0.0 - 0.2 %   Neutrophils Relative % 81 %   Neutro Abs 9.0 (H) 1.7 - 7.7 K/uL   Lymphocytes Relative 14 %   Lymphs Abs 1.6 0.7 - 4.0 K/uL   Monocytes Relative 5 %   Monocytes Absolute 0.6 0.1 - 1.0 K/uL   Eosinophils Relative 0 %   Eosinophils Absolute 0.0 0.0 - 0.5 K/uL   Basophils Relative 0 %   Basophils Absolute 0.0 0.0 - 0.1 K/uL   Immature Granulocytes 0 %   Abs Immature Granulocytes 0.05 0.00 - 0.07 K/uL  Urinalysis, Routine w reflex microscopic Urine, Clean Catch  Result Value Ref Range   Color, Urine YELLOW YELLOW   APPearance CLEAR CLEAR   Specific Gravity, Urine 1.014 1.005 - 1.030   pH 6.0 5.0 - 8.0   Glucose, UA NEGATIVE NEGATIVE mg/dL   Hgb urine dipstick NEGATIVE NEGATIVE   Bilirubin Urine NEGATIVE NEGATIVE   Ketones, ur 5 (A) NEGATIVE mg/dL   Protein, ur 30 (A) NEGATIVE mg/dL   Nitrite NEGATIVE NEGATIVE   Leukocytes,Ua NEGATIVE NEGATIVE   RBC / HPF 0-5 0 - 5 RBC/hpf   WBC, UA 0-5 0 - 5 WBC/hpf   Bacteria, UA NONE SEEN NONE SEEN   Squamous Epithelial / LPF 0-5 0 - 5   DG Chest 2 View  Result Date: 02/06/2021 CLINICAL DATA:  Fever. EXAM: CHEST - 2 VIEW COMPARISON:  None. FINDINGS: The cardiac silhouette is mildly enlarged. Dense breast soft tissue  is seen overlying the bilateral lung bases on the frontal view. No acute infiltrate, pleural effusion or pneumothorax is seen within these regions on the lateral view. The visualized skeletal structures are unremarkable. IMPRESSION: 1. Mild cardiomegaly which may be, in part, technical in origin. 2. Limited frontal view, as described above, without acute cardiopulmonary disease. Electronically Signed   By: Aram Candelahaddeus  Houston M.D.   On: 02/06/2021 01:53     Radiology DG Chest 2 View  Result Date: 02/06/2021 CLINICAL DATA:  Fever. EXAM: CHEST - 2 VIEW COMPARISON:  None. FINDINGS: The cardiac silhouette is mildly enlarged. Dense breast soft tissue is seen overlying the bilateral lung bases on the frontal view. No acute infiltrate, pleural effusion or pneumothorax is seen within these regions on the lateral view. The visualized skeletal structures are unremarkable. IMPRESSION: 1. Mild cardiomegaly which may be, in  part, technical in origin. 2. Limited frontal view, as described above, without acute cardiopulmonary disease. Electronically Signed   By: Aram Candela M.D.   On: 02/06/2021 01:53    Procedures Procedures   Medications Ordered in ED Medications  cefTRIAXone (ROCEPHIN) injection 1 g (has no administration in time range)  ibuprofen (ADVIL) tablet 800 mg (has no administration in time range)  acetaminophen (TYLENOL) tablet 1,000 mg (1,000 mg Oral Given 02/06/21 0138)  potassium chloride SA (KLOR-CON) CR tablet 80 mEq (80 mEq Oral Given 02/06/21 0137)    ED Course  I have reviewed the triage vital signs and the nursing notes.  Pertinent labs & imaging results that were available during my care of the patient were reviewed by me and considered in my medical decision making (see chart for details).   Continue treatment for mastitis and follow up with your OB GYN. Alternate tylenol and ibuprofen for fever control and continue to breast feed.  Strict return precautions given.    Cheryl Maynard was evaluated in Emergency Department on 02/06/2021 for the symptoms described in the history of present illness. She was evaluated in the context of the global COVID-19 pandemic, which necessitated consideration that the patient might be at risk for infection with the SARS-CoV-2 virus that causes COVID-19. Institutional protocols and algorithms that pertain to the evaluation of patients at risk for COVID-19 are in a state of rapid change based on information released by regulatory bodies including the CDC and federal and state organizations. These policies and algorithms were followed during the patient's care in the ED.     Final Clinical Impression(s) / ED Diagnoses Final diagnoses:  Mastitis  Hypokalemia    Return for intractable cough, coughing up blood, fevers > 100.4 unrelieved by medication, shortness of breath, intractable vomiting, chest pain, shortness of breath, weakness, numbness, changes in speech, facial asymmetry, abdominal pain, passing out, Inability to tolerate liquids or food, cough, altered mental status or any concerns. No signs of systemic illness or infection. The patient is nontoxic-appearing on exam and vital signs are within normal limits. I have reviewed the triage vital signs and the nursing notes. Pertinent labs & imaging results that were available during my care of the patient were reviewed by me and considered in my medical decision making (see chart for details). After history, exam, and medical workup I feel the patient has been appropriately medically screened and is safe for discharge home. Pertinent diagnoses were discussed with the patient. Patient was given return precautions. Rx / DC Orders ED Discharge Orders          Ordered    potassium chloride SA (KLOR-CON) 20 MEQ tablet  2 times daily        02/06/21 0330             Cassady Turano, MD 02/06/21 7829

## 2021-02-06 NOTE — Discharge Instructions (Addendum)
Alternate tylenol and ibuprofen, warm compresses, continue to breast feed

## 2021-02-11 ENCOUNTER — Ambulatory Visit: Payer: Medicaid Other | Admitting: Obstetrics & Gynecology

## 2021-02-25 ENCOUNTER — Ambulatory Visit (INDEPENDENT_AMBULATORY_CARE_PROVIDER_SITE_OTHER): Payer: Medicaid Other | Admitting: Obstetrics & Gynecology

## 2021-02-25 ENCOUNTER — Other Ambulatory Visit: Payer: Self-pay

## 2021-02-25 DIAGNOSIS — Z30430 Encounter for insertion of intrauterine contraceptive device: Secondary | ICD-10-CM | POA: Diagnosis not present

## 2021-02-25 DIAGNOSIS — Z3202 Encounter for pregnancy test, result negative: Secondary | ICD-10-CM

## 2021-02-25 LAB — POCT PREGNANCY, URINE: Preg Test, Ur: NEGATIVE

## 2021-02-25 MED ORDER — PARAGARD INTRAUTERINE COPPER IU IUD
1.0000 | INTRAUTERINE_SYSTEM | Freq: Once | INTRAUTERINE | Status: AC
  Administered 2021-02-25: 1 via INTRAUTERINE

## 2021-02-25 NOTE — Progress Notes (Signed)
Post Partum Visit Note  Cheryl Maynard is a 20 y.o. [id number] female who presents for a postpartum visit. She is 7 weeks postpartum following a normal spontaneous vaginal delivery.  I have fully reviewed the prenatal and intrapartum course. The delivery was at 40 gestational weeks.  Anesthesia: epidural. Postpartum course has been complicated by mastitis. Baby is doing well. Baby is feeding by both breast and bottle - Similac Advance. Bleeding no bleeding. Bowel function is normal. Bladder function is normal. Patient is not sexually active. Contraception method is IUD. Postpartum depression screening: negative.   The pregnancy intention screening data noted above was reviewed. Potential methods of contraception were discussed. The patient elected to proceed with No data recorded.    Health Maintenance Due  Topic Date Due   COVID-19 Vaccine (1) Never done   HPV VACCINES (1 - 2-dose series) Never done   CHLAMYDIA SCREENING  Never done   Hepatitis C Screening  Never done   TETANUS/TDAP  Never done   PAP-Cervical Cytology Screening  Never done   PAP SMEAR-Modifier  Never done   INFLUENZA VACCINE  02/15/2021    The following portions of the patient's history were reviewed and updated as appropriate: allergies, current medications, past family history, past medical history, past social history, past surgical history, and problem list.  Review of Systems Pertinent items are noted in HPI.  Objective:  LMP 04/01/2020    General:  alert, cooperative, and no distress   Breasts:  not indicated  Lungs: Effort normal  Heart:  regular rate and rhythm  Abdomen: soft, non-tender; bowel sounds normal; no masses,  no organomegaly      GU exam:  normal      Patient identified, informed consent performed, signed copy in chart, time out was performed.  Urine pregnancy test negative.  Speculum placed in the vagina.  Cervix visualized.  Cleaned with Betadine x 2.  Grasped  anteriourly/posteriorly with a single tooth tenaculum.  Uterus sounded to 8 cm.  ParaGard IUD placed per manufacturer's recommendations.  Strings trimmed to 3 cm. Pt tolerated procedure well.  Patient given post procedure instructions and IUD care card with expiration date.  Patient is asked to check IUD strings periodically and follow up in 4-6 weeks for IUD check  Assessment:    There are no diagnoses linked to this encounter.  normal postpartum exam.   Plan:   Essential components of care per ACOG recommendations:  1.  Mood and well being: Patient with negative depression screening today. Reviewed local resources for support.  - Patient tobacco use? No.   - hx of drug use? No.    2. Infant care and feeding:  -Patient currently breastmilk feeding? Yes. Reviewed importance of draining breast regularly to support lactation.  -Social determinants of health (SDOH) reviewed in EPIC. No concerns 3. Sexuality, contraception and birth spacing - Patient does not want a pregnancy in the next year.  Desired family size is 2 children.  - Reviewed forms of contraception in tiered fashion. Patient desired IUD today.   - Discussed birth spacing of 18 months  4. Sleep and fatigue -Encouraged family/partner/community support of 4 hrs of uninterrupted sleep to help with mood and fatigue  5. Physical Recovery  - Discussed patients delivery and complications. She describes her labor as good. - Patient had a Vaginal, no problems at delivery. - Patient has urinary incontinence? No. - Patient is safe to resume physical and sexual activity  6.  Health Maintenance -  HM due items addressed Yes - Last pap smear No results found for: DIAGPAP Pap smear notdone at today's visit.  -Breast Cancer screening indicated? No.   7. Chronic Disease/Pregnancy Condition follow up: None  Adam Phenix, MD [facility], [facility]

## 2021-03-25 ENCOUNTER — Telehealth: Payer: Self-pay | Admitting: Obstetrics & Gynecology

## 2021-03-25 ENCOUNTER — Ambulatory Visit: Payer: Medicaid Other | Admitting: Obstetrics & Gynecology

## 2021-04-06 ENCOUNTER — Ambulatory Visit (INDEPENDENT_AMBULATORY_CARE_PROVIDER_SITE_OTHER): Payer: Medicaid Other

## 2021-04-06 ENCOUNTER — Other Ambulatory Visit: Payer: Self-pay

## 2021-04-06 VITALS — BP 108/70 | HR 73 | Wt 141.6 lb

## 2021-04-06 DIAGNOSIS — Z30431 Encounter for routine checking of intrauterine contraceptive device: Secondary | ICD-10-CM

## 2021-04-06 NOTE — Progress Notes (Signed)
Patient is here for follow up "string check" from IUD insertion. Patient stated that she is feeling fine and has no questions, comments or concerns   Aldrick Derrig, CMA    04/06/21

## 2021-04-06 NOTE — Progress Notes (Signed)
   GYNECOLOGY CLINIC PROGRESS NOTE  History:  20 y.o. [id number] here at [facility] today for today for IUD string check; Paragard IUD was placed 02/25/2021. No complaints about the IUD, no concerning side effects.  The following portions of the patient's history were reviewed and updated as appropriate: allergies, current medications, past family history, past medical history, past social history, past surgical history and problem list. Last pap smear on never had.  Review of Systems:  Pertinent items are noted in HPI.   Objective:  Physical Exam Blood pressure 108/70, pulse 73, weight 141 lb 9.6 oz (64.2 kg), currently breastfeeding. Gen: NAD Abd: Soft, nontender and nondistended Pelvic: Normal appearing external genitalia; normal appearing vaginal mucosa and cervix. IUD strings visualized, about approximately 3 cm in length outside cervix.   Assessment & Plan:  Normal IUD check. Patient to keep IUD in place for ten years; can come in for removal if she desires pregnancy within the next ten years. Routine preventative health maintenance measures emphasized. After patient left, it was discovered patient has not ever had a pap smear. Patient contacted after today's visit to schedule pap smear and annual GYN visit.     Brand Males, CNM 04/06/21 4:14 PM

## 2021-09-28 ENCOUNTER — Other Ambulatory Visit (HOSPITAL_COMMUNITY)
Admission: RE | Admit: 2021-09-28 | Discharge: 2021-09-28 | Disposition: A | Discharge status: Home / Self Care | Payer: Medicaid Other | Source: Ambulatory Visit | Attending: Obstetrics & Gynecology | Admitting: Obstetrics & Gynecology

## 2021-09-28 ENCOUNTER — Other Ambulatory Visit: Payer: Self-pay

## 2021-09-28 ENCOUNTER — Ambulatory Visit: Payer: Medicaid Other | Admitting: Obstetrics & Gynecology

## 2021-09-28 ENCOUNTER — Encounter: Payer: Self-pay | Admitting: Obstetrics & Gynecology

## 2021-09-28 VITALS — BP 111/73 | HR 57 | Ht 60.0 in | Wt 132.5 lb

## 2021-09-28 DIAGNOSIS — Z975 Presence of (intrauterine) contraceptive device: Secondary | ICD-10-CM

## 2021-09-28 DIAGNOSIS — Z01419 Encounter for gynecological examination (general) (routine) without abnormal findings: Secondary | ICD-10-CM

## 2021-09-28 NOTE — Progress Notes (Signed)
Pt in office for annual exam. She is requesting STI testing today and has concerns about her IUD. She does not have any pain or discomfort from her IUD.  ?

## 2021-09-28 NOTE — Progress Notes (Signed)
Patient ID: Cheryl Maynard, female   DOB: 01/25/2001, 21 y.o.   MRN: [id number] ? ?Cc: annual exam, SVD 9 mo ago ? ? ?HPI ?Cheryl Maynard is a 21 y.o. female.  [id number] ?No LMP recorded. ?She comes for annual exam. Last pap 07/2020 normal absent endocervical cells during pregnancy and she was told to repeat. Paragard in place. She is breastfeeding  ?HPI ? ?Past Medical History:  ?Diagnosis Date  ? Medical history non-contributory   ? ? ?Past Surgical History:  ?Procedure Laterality Date  ? WISDOM TOOTH EXTRACTION    ? ? ?Family History  ?Problem Relation Age of Onset  ? Anemia Mother   ? ADD / ADHD Neg Hx   ? Alcohol abuse Neg Hx   ? Anxiety disorder Neg Hx   ? Asthma Neg Hx   ? Arthritis Neg Hx   ? Birth defects Neg Hx   ? Cancer Neg Hx   ? COPD Neg Hx   ? Depression Neg Hx   ? Diabetes Neg Hx   ? Drug abuse Neg Hx   ? Hearing loss Neg Hx   ? Early death Neg Hx   ? Heart disease Neg Hx   ? Hyperlipidemia Neg Hx   ? Hypertension Neg Hx   ? Intellectual disability Neg Hx   ? Kidney disease Neg Hx   ? Learning disabilities Neg Hx   ? Miscarriages / Stillbirths Neg Hx   ? Obesity Neg Hx   ? Stroke Neg Hx   ? Vision loss Neg Hx   ? Varicose Veins Neg Hx   ? ? ?Social History ?Social History  ? ?Tobacco Use  ? Smoking status: Former  ?  Types: Cigarettes  ? Smokeless tobacco: Never  ? Tobacco comments:  ?  quit with +preg test  ?Vaping Use  ? Vaping Use: Former  ? Substances: Nicotine  ?Substance Use Topics  ? Alcohol use: No  ? Drug use: No  ? ? ?No Known Allergies ? ?Current Outpatient Medications  ?Medication Sig Dispense Refill  ? sertraline (ZOLOFT) 50 MG tablet Take 50 mg by mouth daily.    ? acetaminophen (TYLENOL) 325 MG tablet Take 2 tablets (650 mg total) by mouth every 6 (six) hours as needed (for pain scale < 4). (Patient not taking: Reported on 02/25/2021) 30 tablet 0  ? ibuprofen (ADVIL) 600 MG tablet Take 1 tablet (600 mg total) by mouth every 6 (six) hours. (Patient not taking: Reported on  02/25/2021) 30 tablet 0  ? potassium chloride SA (KLOR-CON) 20 MEQ tablet Take 1 tablet (20 mEq total) by mouth 2 (two) times daily. (Patient not taking: Reported on 02/25/2021) 10 tablet 0  ? Prenatal Vit-Fe Fumarate-FA (MULTIVITAMIN-PRENATAL) 27-0.8 MG TABS tablet Take 1 tablet by mouth daily at 12 noon. (Patient not taking: Reported on 02/25/2021)    ? senna-docusate (SENOKOT-S) 8.6-50 MG tablet Take 2 tablets by mouth daily. (Patient not taking: Reported on 01/22/2021) 30 tablet 0  ? ?No current facility-administered medications for this visit.  ? ? ?Review of Systems ?Review of Systems  ?Constitutional: Negative.   ?Respiratory: Negative.    ?Gastrointestinal: Negative.   ?Genitourinary:  Negative for menstrual problem, pelvic pain, vaginal bleeding and vaginal discharge.  ? ?Blood pressure 111/73, pulse (!) 57, height 5' (1.524 m), weight 132 lb 8 oz (60.1 kg), currently breastfeeding. ? ?Physical Exam ?Physical Exam ?Vitals and nursing note reviewed. Exam conducted with a chaperone present.  ?Constitutional:   ?   Appearance: Normal appearance.  ?  HENT:  ?   Head: Normocephalic.  ?Cardiovascular:  ?   Rate and Rhythm: Normal rate.  ?Pulmonary:  ?   Effort: Pulmonary effort is normal.  ?Chest:  ?Breasts: ?   Right: Normal.  ?   Left: Normal.  ? ? ?   Comments: Dark mole 8 mm right breast stable per patient ?Abdominal:  ?   General: Abdomen is flat.  ?   Palpations: Abdomen is soft.  ?Genitourinary: ?   General: Normal vulva.  ?   Exam position: Lithotomy position.  ?   Vagina: Normal. No vaginal discharge.  ?   Cervix: Normal.  ?   Uterus: Normal.   ?   Adnexa: Right adnexa normal and left adnexa normal.  ?   Comments: IUD string 2 cm ?Musculoskeletal:  ?   Cervical back: Normal range of motion.  ?Neurological:  ?   Mental Status: She is alert.  ?Psychiatric:     ?   Mood and Affect: Mood normal.     ?   Behavior: Behavior normal.  ? ? ?Data Reviewed ? ?Pap 07/2020 ?Assessment ?Well woman exam with routine  gynecological exam ? ?IUD (intrauterine device) in place ? ? ?Plan ?Orders Placed This Encounter  ?Procedures  ? HepB+HepC+HIV Panel  ? RPR  ?RTC for annual exams ? ? ? ? ? ? ?Emeterio Reeve ?09/28/2021, 3:30 PM ? ? ? ?

## 2021-09-29 LAB — HEPB+HEPC+HIV PANEL
HIV Screen 4th Generation wRfx: NONREACTIVE
Hep B C IgM: NEGATIVE
Hep B Core Total Ab: NEGATIVE
Hep B E Ab: NEGATIVE
Hep B E Ag: NEGATIVE
Hep B Surface Ab, Qual: NONREACTIVE
Hep C Virus Ab: NONREACTIVE
Hepatitis B Surface Ag: NEGATIVE

## 2021-09-29 LAB — RPR: RPR Ser Ql: NONREACTIVE

## 2021-09-30 LAB — CERVICOVAGINAL ANCILLARY ONLY
Chlamydia: NEGATIVE
Comment: NEGATIVE
Comment: NEGATIVE
Comment: NORMAL
Neisseria Gonorrhea: NEGATIVE
Trichomonas: NEGATIVE

## 2021-09-30 LAB — CYTOLOGY - PAP: Diagnosis: NEGATIVE

## 2022-10-24 IMAGING — US US MFM OB DETAIL+14 WK
1 series · 13 of 28 positions shown · non-contrast
Comparison: none

[Series 1: us mfm ob detail+14 wk · 13 of 163 slices shown]
[im 7/163]
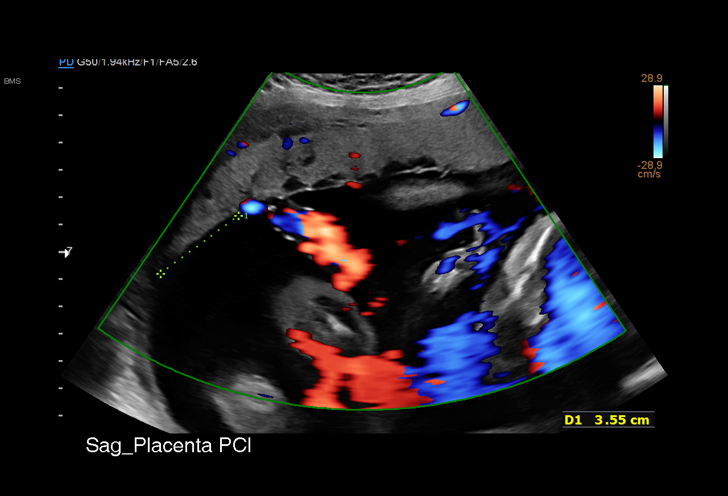
[im 19/163]
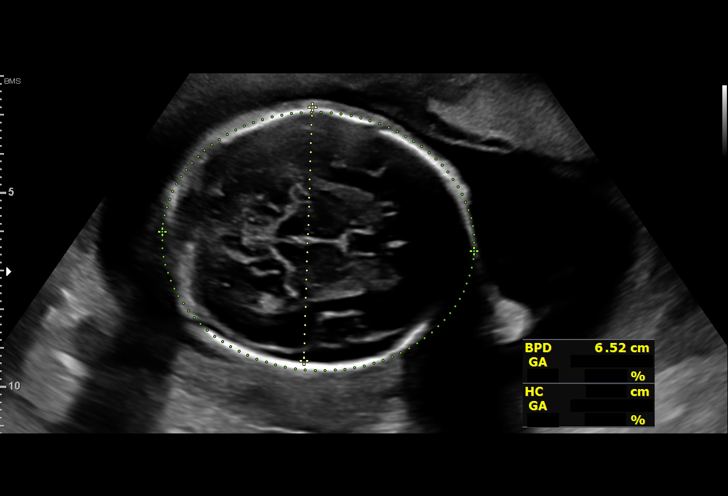
[im 31/163]
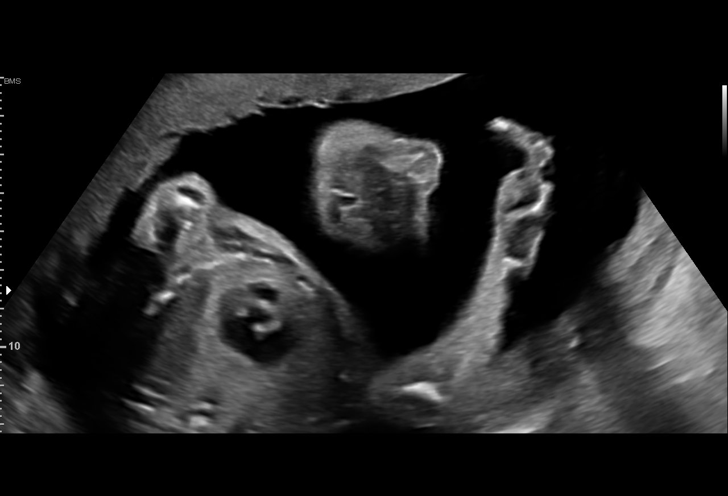
[im 43/163]
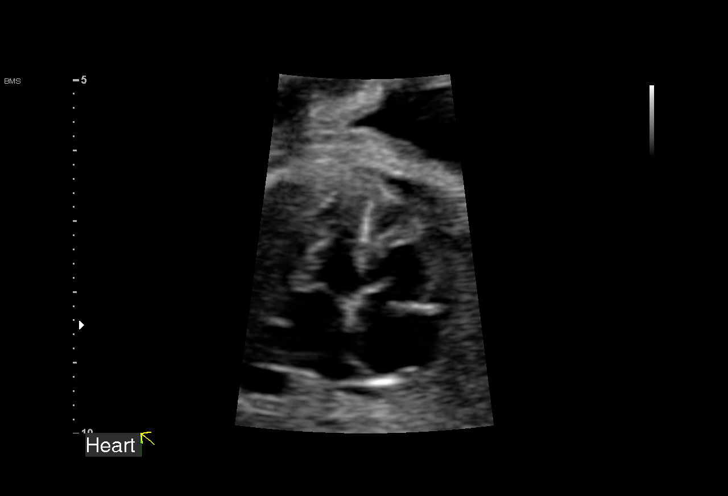
[im 55/163]
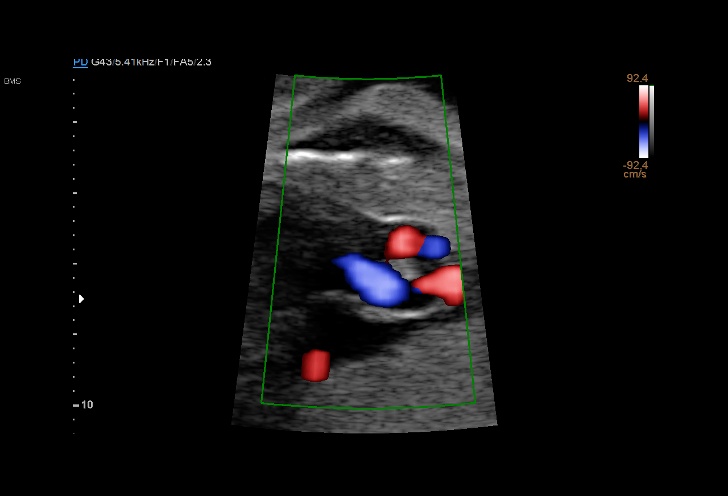
[im 67/163]
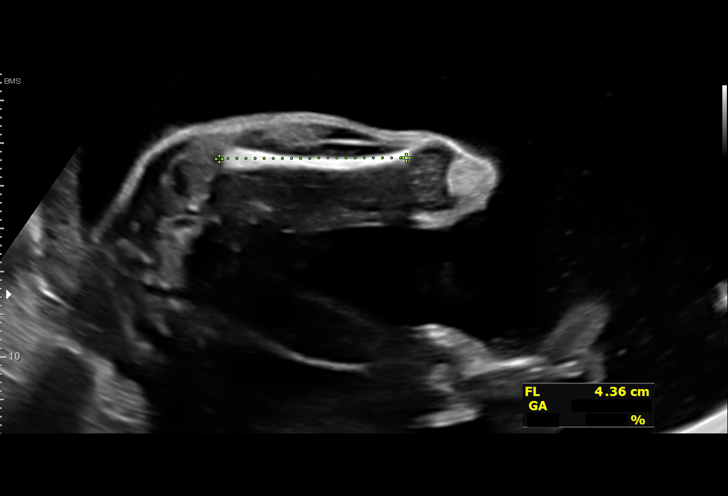
[im 85/163]
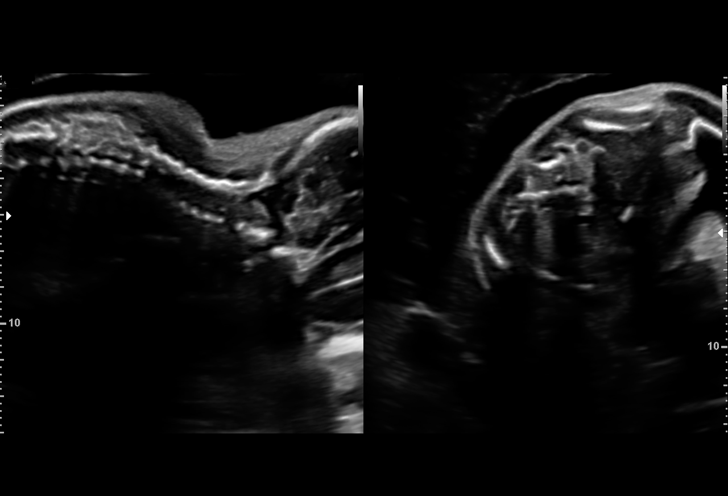
[im 97/163]
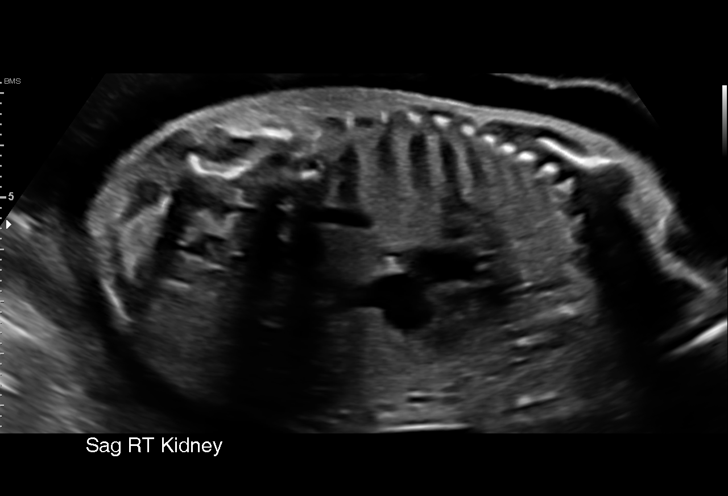
[im 109/163]
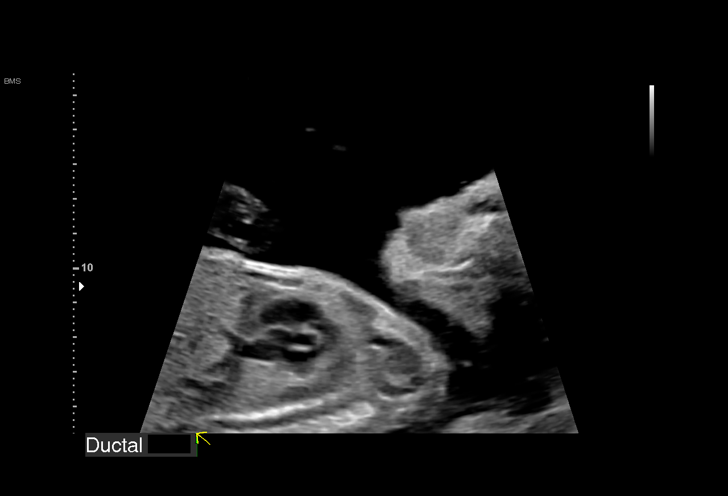
[im 121/163]
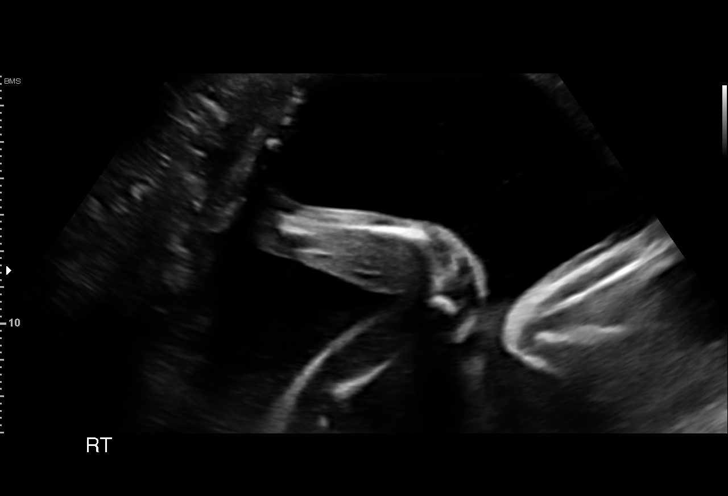
[im 133/163]
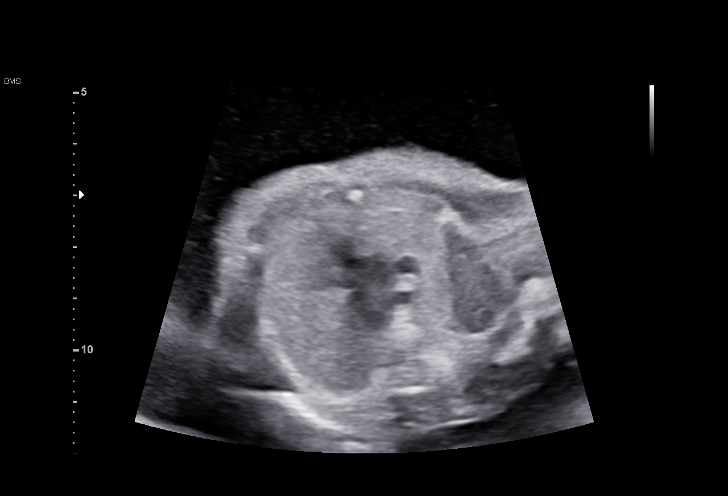
[im 145/163]
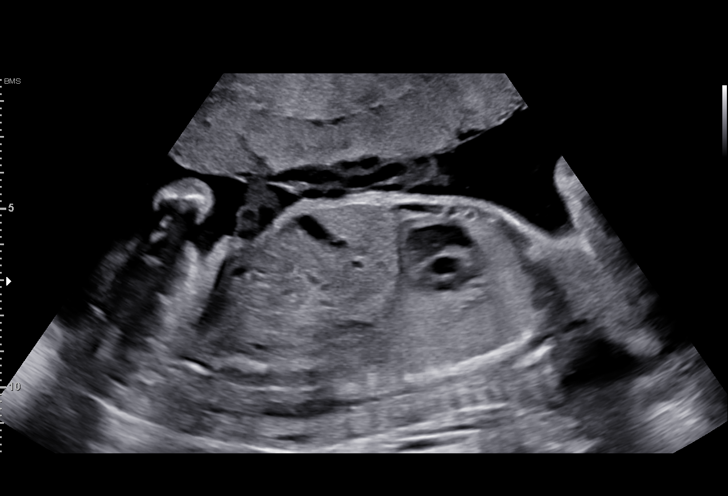
[im 157/163]
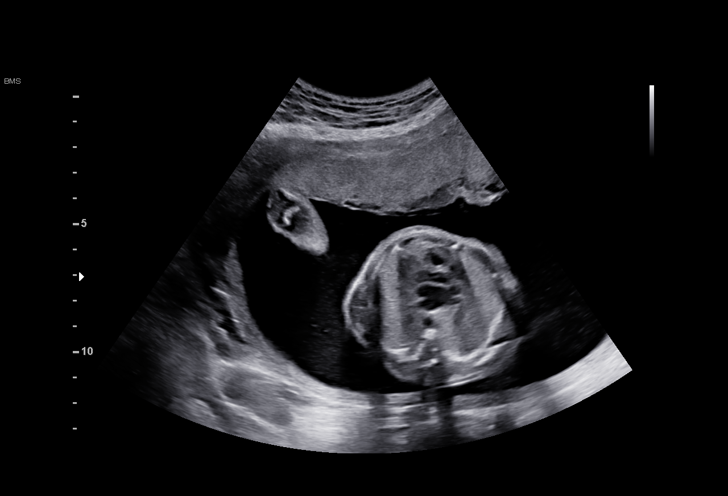

[13 of 28 positions shown; findings below may reference images not displayed]

[REDACTED]

Indications

 Medical complication of pregnancy
 (Chlamydia)
 Encounter for antenatal screening for
 malformations (Elevated AFP 2.17)
 24 weeks gestation of pregnancy
Fetal Evaluation

 Num Of Fetuses:         1
 Fetal Heart Rate(bpm):  153
 Cardiac Activity:       Observed
 Presentation:           Cephalic
 Placenta:               Anterior
 P. Cord Insertion:      Visualized

 Amniotic Fluid
 AFI FV:      Within normal limits

                             Largest Pocket(cm)

Biometry

 BPD:      65.5  mm     G. Age:  26w 3d         89  %    CI:        79.68   %    70 - 86
                                                         FL/HC:      18.5   %    18.7 -
 HC:      231.9  mm     G. Age:  25w 2d         42  %    HC/AC:      1.05        1.04 -
 AC:       220   mm     G. Age:  26w 3d         86  %    FL/BPD:     65.6   %    71 - 87
 FL:         43  mm     G. Age:  24w 0d         16  %    FL/AC:      19.5   %    20 - 24
 HUM:      40.3  mm     G. Age:  24w 3d         34  %
 CER:      28.9  mm     G. Age:  25w 3d         70  %

 LV:        4.2  mm
 CM:        4.9  mm

 Est. FW:     816  gm    1 lb 13 oz      69  %
OB History

 Gravidity:    2
 Living:       1
Gestational Age

 LMP:           24w 6d        Date:  04/01/20                 EDD:   01/06/21
 U/S Today:     25w 4d                                        EDD:   01/01/21
 Best:          24w 6d     Det. By:  LMP  (04/01/20)          EDD:   01/06/21
Anatomy

 Cranium:               Appears normal         LVOT:                   Appears normal
 Cavum:                 Appears normal         Aortic Arch:            Appears normal
 Ventricles:            Appears normal         Ductal Arch:            Appears normal
 Choroid Plexus:        Appears normal         Diaphragm:              Appears normal
 Cerebellum:            Appears normal         Stomach:                Appears normal, left
                                                                       sided
 Posterior Fossa:       Appears normal         Abdomen:                Appears normal
 Nuchal Fold:           Not applicable (>20    Abdominal Wall:         Appears nml (cord
                        wks GA)                                        insert, abd wall)
 Face:                  Appears normal         Cord Vessels:           Appears normal (3
                        (orbits and profile)                           vessel cord)
 Lips:                  Appears normal         Kidneys:                Appear normal
 Palate:                Not well visualized    Bladder:                Appears normal
 Thoracic:              Appears normal         Spine:                  Appears normal
 Heart:                 Appears normal         Upper Extremities:      Appears normal
                        (4CH, axis, and
                        situs)
 RVOT:                  Appears normal         Lower Extremities:      Appears normal

 Other:  Heels/feet and open hands/5th digits visualized. Lenses visualized.
         Nasal bone visualized. Fetus appears to be a male.
Cervix Uterus Adnexa

 Cervix
 Length:           4.69  cm.
 Normal appearance by transabdominal scan.

 Uterus
 No abnormality visualized.

 Right Ovary
 Not visualized.

 Left Ovary
 Not visualized.
 Cul De Sac
 No free fluid seen.

 Adnexa
 No adnexal mass visualized.
Comments

 This patient was seen due to an elevated MSAFP of
 MoM noted on her quad screen.  Her quad screen also
 indicated a final Down syndrome and trisomy 18 risk of 1 in
 [DATE].  She reports no complications in her current
 pregnancy.  She denies any significant past medical or family
 history.
 The fetal growth and amniotic fluid level were appropriate for
 her gestational age.
 The patient was advised of the ultrasound findings that failed
 to reveal an anatomical cause for the increased MSAFP.
 There were no sonographic signs of spina bifida or an
 anterior abdominal wall defect noted today.  She was advised
 regarding the limitations of ultrasound in the detection of all
 anomalies and that it will diagnose approximately 90% of
 neural tube defects.  The association of an elevated MSAFP
 with placental dysfunction which may manifest later in her
 pregnancy as fetal growth restriction along with with other
 adverse pregnancy outcomes such as fetal demise was
 discussed.
 Due to the elevated MSAFP, the patient was offered and
 declined an amniocentesis today for definitive diagnosis of
 fetal aneuploidy and spina bifida.
 Due to the elevated MSAFP, we will continue to follow her
 with growth ultrasounds throughout her pregnancy.
 A follow-up exam was scheduled in 4 weeks.

## 2023-03-10 IMAGING — CR DG CHEST 2V
2 series · 2 of 2 positions shown · non-contrast
Comparison: None.

CLINICAL DATA: Fever.

EXAM:
CHEST - 2 VIEW

[chest pa]
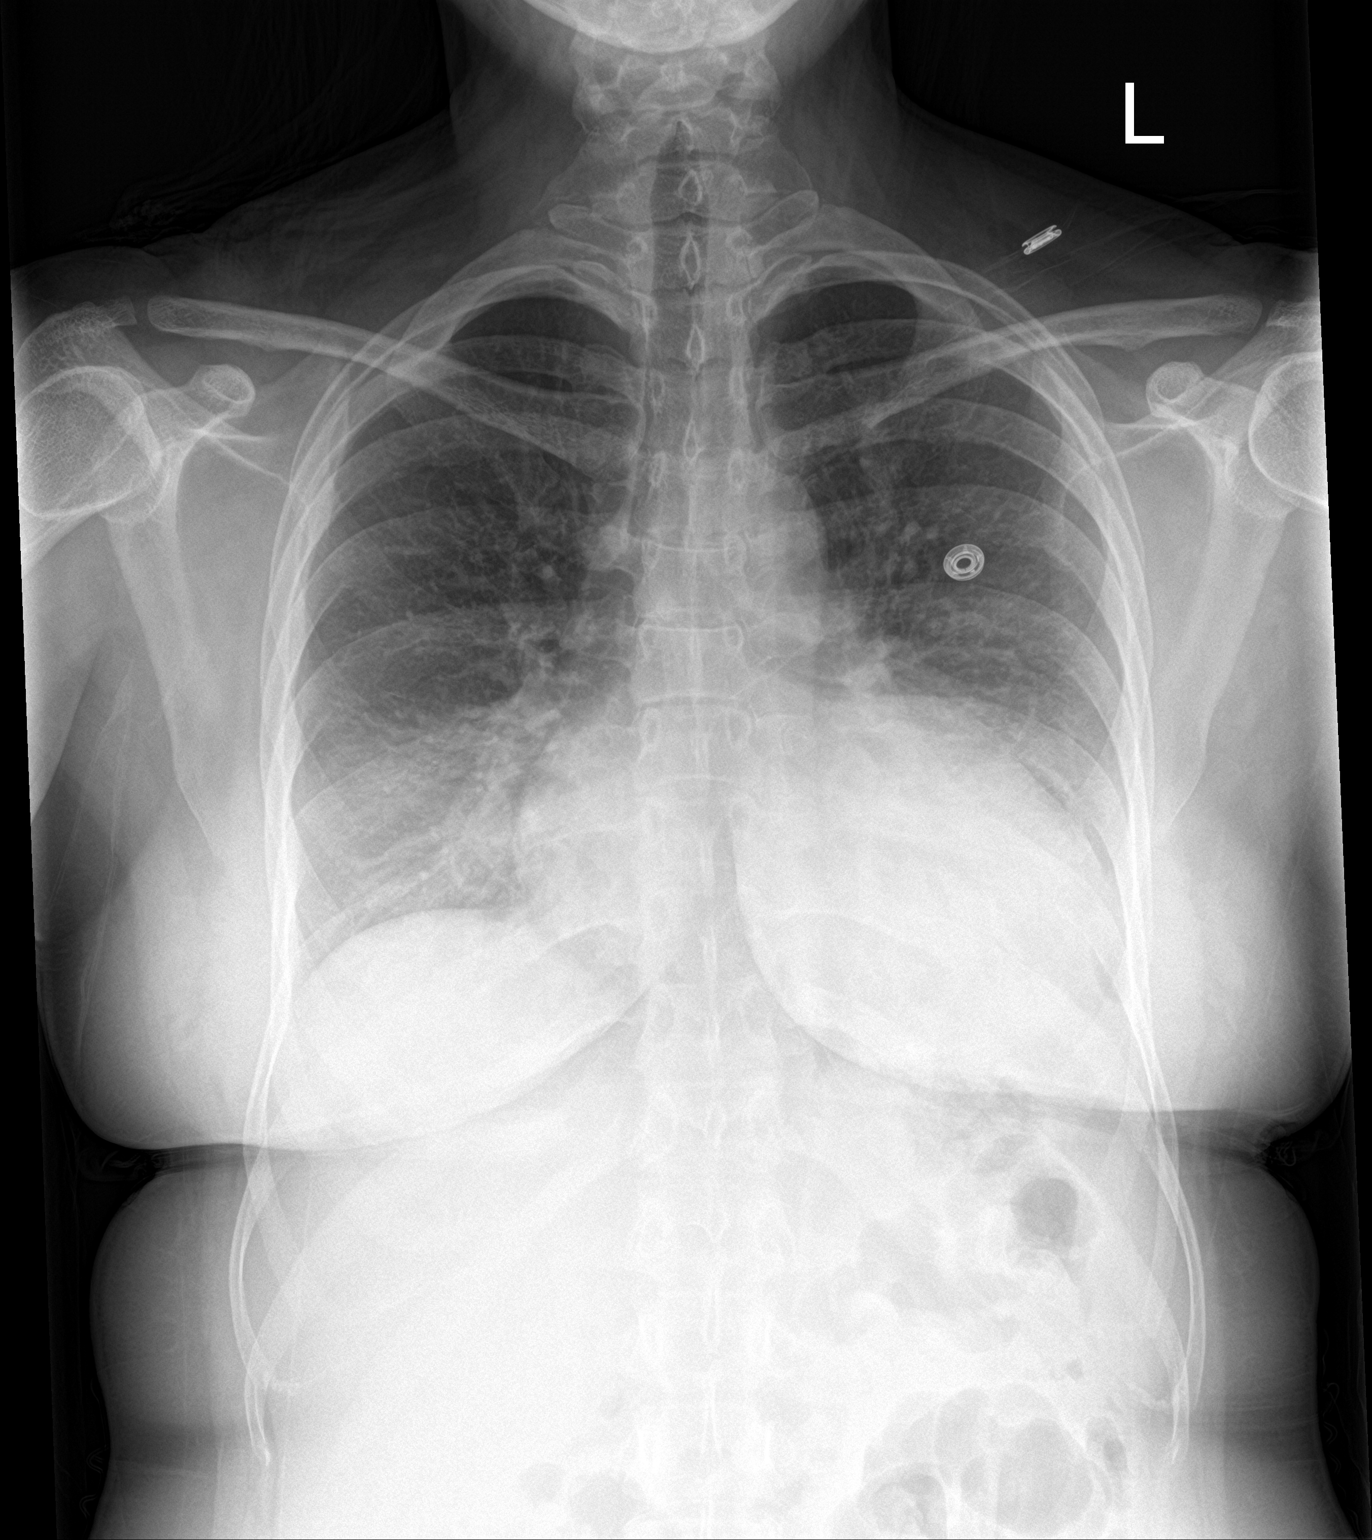

[chest lat]
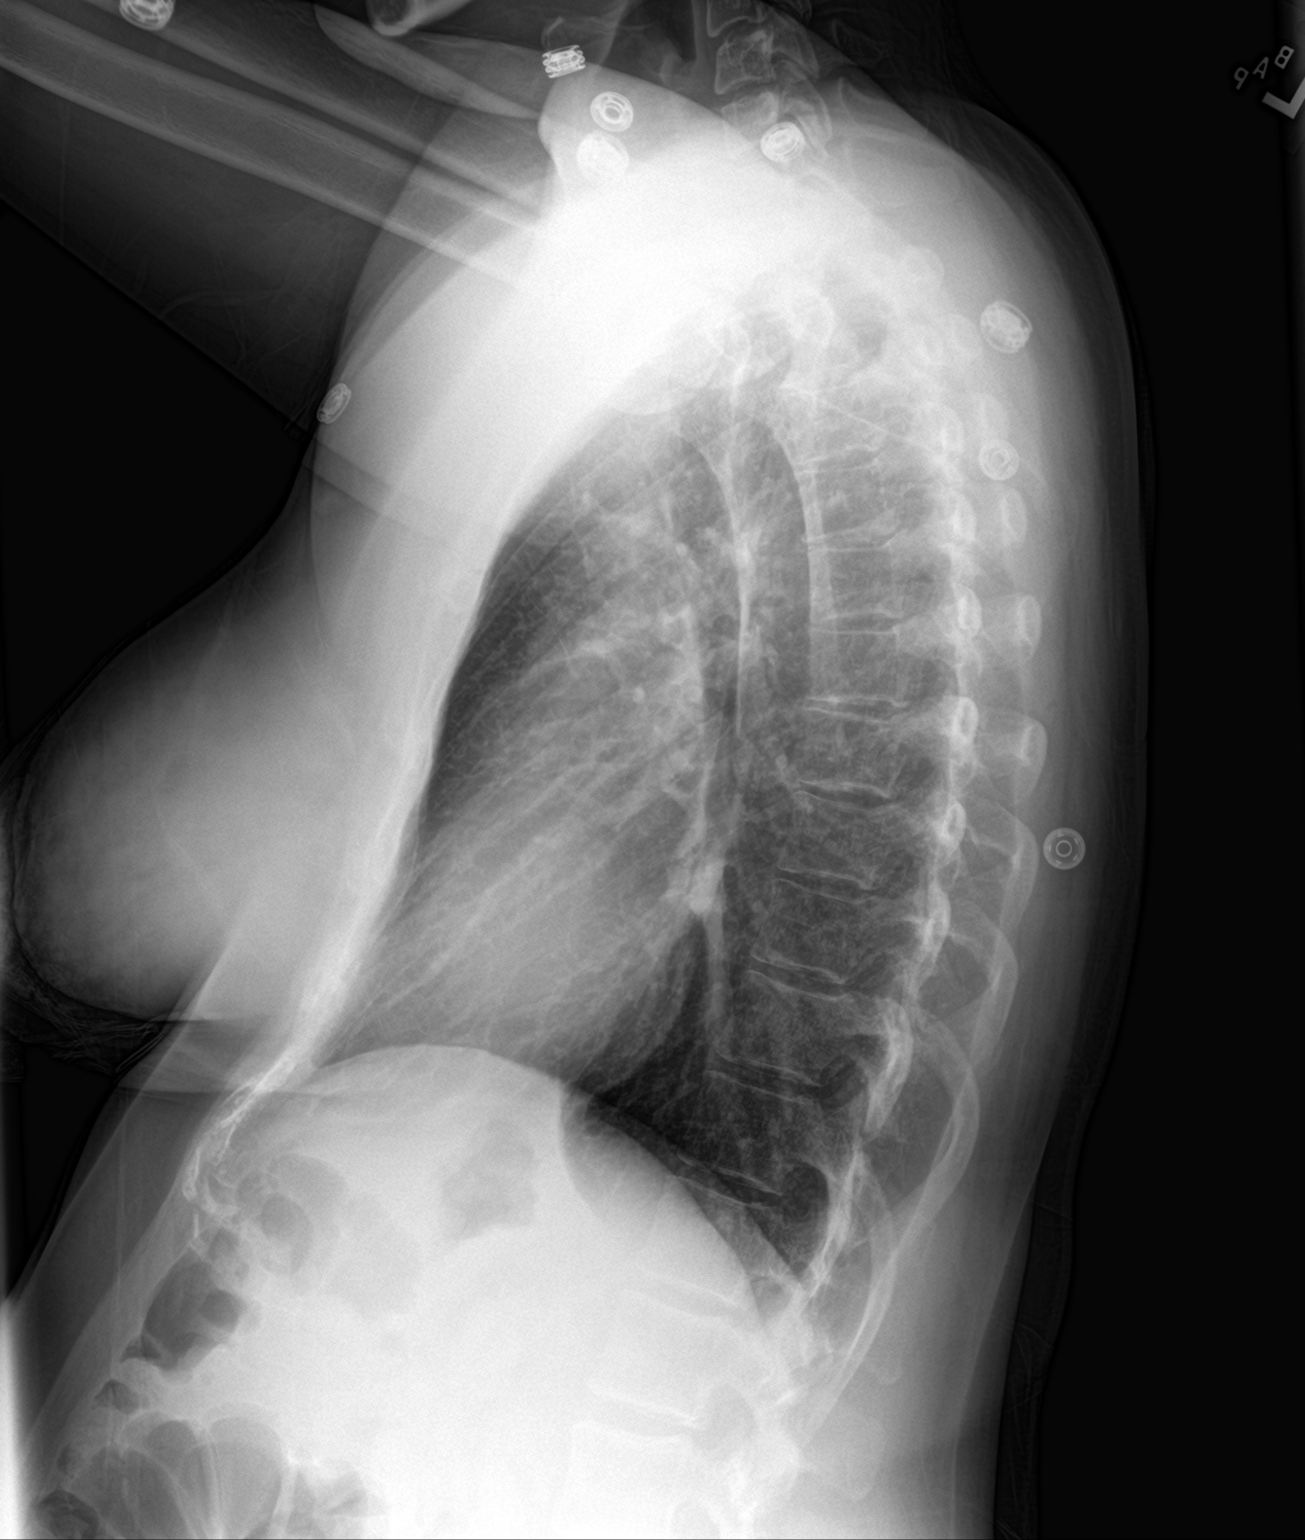

[2 of 2 positions shown; findings below may reference images not displayed]

FINDINGS: The cardiac silhouette is mildly enlarged. Dense breast soft tissue
is seen overlying the bilateral lung bases on the frontal view. No
acute infiltrate, pleural effusion or pneumothorax is seen within
these regions on the lateral view. The visualized skeletal
structures are unremarkable.
IMPRESSION: 1. Mild cardiomegaly which may be, in part, technical in origin.
2. Limited frontal view, as described above, without acute
cardiopulmonary disease.
